# Patient Record
Sex: Female | Born: 1950 | Race: Black or African American | Hispanic: No | Marital: Married | Smoking: Former smoker
Health system: Southern US, Community
[De-identification: ages and names within clinical notes are randomized; demographics above are authoritative.]

## PROBLEM LIST (undated history)

## (undated) DIAGNOSIS — E785 Hyperlipidemia, unspecified: Secondary | ICD-10-CM

## (undated) DIAGNOSIS — R011 Cardiac murmur, unspecified: Secondary | ICD-10-CM

## (undated) DIAGNOSIS — K219 Gastro-esophageal reflux disease without esophagitis: Secondary | ICD-10-CM

## (undated) DIAGNOSIS — F329 Major depressive disorder, single episode, unspecified: Secondary | ICD-10-CM

## (undated) DIAGNOSIS — I10 Essential (primary) hypertension: Secondary | ICD-10-CM

## (undated) DIAGNOSIS — H269 Unspecified cataract: Secondary | ICD-10-CM

## (undated) DIAGNOSIS — F419 Anxiety disorder, unspecified: Secondary | ICD-10-CM

## (undated) DIAGNOSIS — F32A Depression, unspecified: Secondary | ICD-10-CM

## (undated) DIAGNOSIS — J189 Pneumonia, unspecified organism: Secondary | ICD-10-CM

## (undated) DIAGNOSIS — E0500 Thyrotoxicosis with diffuse goiter without thyrotoxic crisis or storm: Secondary | ICD-10-CM

## (undated) HISTORY — DX: Thyrotoxicosis with diffuse goiter without thyrotoxic crisis or storm: E05.00

## (undated) HISTORY — PX: PARTIAL HYSTERECTOMY: SHX80

## (undated) HISTORY — DX: Essential (primary) hypertension: I10

## (undated) HISTORY — PX: POLYPECTOMY: SHX149

## (undated) HISTORY — DX: Unspecified cataract: H26.9

## (undated) HISTORY — DX: Cardiac murmur, unspecified: R01.1

## (undated) HISTORY — DX: Hyperlipidemia, unspecified: E78.5

---
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
## 1998-01-20 ENCOUNTER — Other Ambulatory Visit: Admission: RE | Admit: 1998-01-20 | Discharge: 1998-01-20 | Payer: Self-pay | Admitting: Obstetrics and Gynecology

## 1998-02-27 ENCOUNTER — Ambulatory Visit (HOSPITAL_COMMUNITY): Admission: RE | Admit: 1998-02-27 | Discharge: 1998-02-27 | Payer: Self-pay | Admitting: Obstetrics and Gynecology

## 1999-01-30 ENCOUNTER — Other Ambulatory Visit: Admission: RE | Admit: 1999-01-30 | Discharge: 1999-01-30 | Payer: Self-pay | Admitting: Obstetrics and Gynecology

## 1999-03-09 ENCOUNTER — Ambulatory Visit (HOSPITAL_COMMUNITY): Admission: RE | Admit: 1999-03-09 | Discharge: 1999-03-09 | Payer: Self-pay | Admitting: Obstetrics and Gynecology

## 1999-03-09 ENCOUNTER — Encounter: Payer: Self-pay | Admitting: Obstetrics and Gynecology

## 2000-01-30 ENCOUNTER — Other Ambulatory Visit: Admission: RE | Admit: 2000-01-30 | Discharge: 2000-01-30 | Payer: Self-pay | Admitting: Obstetrics and Gynecology

## 2000-03-10 ENCOUNTER — Ambulatory Visit (HOSPITAL_COMMUNITY): Admission: RE | Admit: 2000-03-10 | Discharge: 2000-03-10 | Payer: Self-pay | Admitting: Obstetrics and Gynecology

## 2000-03-10 ENCOUNTER — Encounter: Payer: Self-pay | Admitting: Obstetrics and Gynecology

## 2001-03-26 ENCOUNTER — Ambulatory Visit (HOSPITAL_COMMUNITY): Admission: RE | Admit: 2001-03-26 | Discharge: 2001-03-26 | Payer: Self-pay | Admitting: Obstetrics and Gynecology

## 2001-03-26 ENCOUNTER — Encounter: Payer: Self-pay | Admitting: Obstetrics and Gynecology

## 2001-10-06 ENCOUNTER — Other Ambulatory Visit: Admission: RE | Admit: 2001-10-06 | Discharge: 2001-10-06 | Payer: Self-pay | Admitting: Obstetrics and Gynecology

## 2002-04-20 ENCOUNTER — Encounter: Payer: Self-pay | Admitting: Obstetrics and Gynecology

## 2002-04-20 ENCOUNTER — Ambulatory Visit (HOSPITAL_COMMUNITY): Admission: RE | Admit: 2002-04-20 | Discharge: 2002-04-20 | Payer: Self-pay | Admitting: Obstetrics and Gynecology

## 2002-11-09 ENCOUNTER — Other Ambulatory Visit: Admission: RE | Admit: 2002-11-09 | Discharge: 2002-11-09 | Payer: Self-pay | Admitting: Obstetrics and Gynecology

## 2004-11-04 HISTORY — PX: COLONOSCOPY: SHX174

## 2004-12-20 ENCOUNTER — Ambulatory Visit: Payer: Self-pay | Admitting: Internal Medicine

## 2005-01-03 ENCOUNTER — Ambulatory Visit: Payer: Self-pay | Admitting: Internal Medicine

## 2005-02-27 ENCOUNTER — Ambulatory Visit (HOSPITAL_COMMUNITY): Admission: RE | Admit: 2005-02-27 | Discharge: 2005-02-27 | Payer: Self-pay | Admitting: Obstetrics and Gynecology

## 2008-10-04 ENCOUNTER — Ambulatory Visit (HOSPITAL_COMMUNITY): Admission: RE | Admit: 2008-10-04 | Discharge: 2008-10-04 | Payer: Self-pay | Admitting: Internal Medicine

## 2008-11-04 HISTORY — PX: CATARACT EXTRACTION: SUR2

## 2009-11-09 ENCOUNTER — Encounter: Admission: RE | Admit: 2009-11-09 | Discharge: 2009-11-09 | Payer: Self-pay | Admitting: Family Medicine

## 2010-07-17 ENCOUNTER — Ambulatory Visit (HOSPITAL_COMMUNITY): Admission: RE | Admit: 2010-07-17 | Discharge: 2010-07-17 | Payer: Self-pay | Admitting: Internal Medicine

## 2012-01-01 ENCOUNTER — Encounter: Payer: Self-pay | Admitting: Internal Medicine

## 2012-01-20 ENCOUNTER — Encounter: Payer: Self-pay | Admitting: Internal Medicine

## 2012-02-24 ENCOUNTER — Ambulatory Visit (AMBULATORY_SURGERY_CENTER): Payer: PRIVATE HEALTH INSURANCE | Admitting: *Deleted

## 2012-02-24 VITALS — Ht 61.5 in | Wt 174.1 lb

## 2012-02-24 DIAGNOSIS — Z860100 Personal history of colon polyps, unspecified: Secondary | ICD-10-CM

## 2012-02-24 DIAGNOSIS — Z1211 Encounter for screening for malignant neoplasm of colon: Secondary | ICD-10-CM

## 2012-02-24 DIAGNOSIS — Z800 Family history of malignant neoplasm of digestive organs: Secondary | ICD-10-CM

## 2012-02-24 MED ORDER — PEG-KCL-NACL-NASULF-NA ASC-C 100 G PO SOLR: 1.0000 | Freq: Once | ORAL | Status: DC

## 2012-03-05 ENCOUNTER — Encounter: Payer: Self-pay | Admitting: Internal Medicine

## 2012-03-05 ENCOUNTER — Ambulatory Visit (AMBULATORY_SURGERY_CENTER): Payer: PRIVATE HEALTH INSURANCE | Admitting: Internal Medicine

## 2012-03-05 VITALS — BP 154/85 | HR 58 | Temp 98.5°F | Resp 19 | Ht 61.5 in | Wt 174.0 lb

## 2012-03-05 DIAGNOSIS — Z1211 Encounter for screening for malignant neoplasm of colon: Secondary | ICD-10-CM

## 2012-03-05 DIAGNOSIS — D126 Benign neoplasm of colon, unspecified: Secondary | ICD-10-CM

## 2012-03-05 MED ORDER — SODIUM CHLORIDE 0.9 % IV SOLN: 500.0000 mL | INTRAVENOUS | Status: DC

## 2012-03-05 NOTE — Progress Notes (Signed)
Propofol was administered by Marrion Coy, CRNA to the for the procedure. Maw

## 2012-03-05 NOTE — Progress Notes (Signed)
The pt tolerated the colonoscopy very well. Maw   

## 2012-03-05 NOTE — Op Note (Signed)
[facility] [address] [place], [place]  [address]  COLONOSCOPY PROCEDURE REPORT  PATIENT:  Kristy Yang, Kristy Yang  MR#:  [id number] BIRTHDATE:  04/07/1951, 60 yrs. old  GENDER:  female ENDOSCOPIST:  Hedwig Morton. Juanda Chance, MD REF. BY:  Robert Bellow, M.D. PROCEDURE DATE:  03/05/2012 PROCEDURE:  Colonoscopy with biopsy ASA CLASS:  Class I INDICATIONS:  history of polyps last colon 2006, small polyp not removed maternal aunt with colon cancer MEDICATIONS:   MAC sedation, administered by CRNA, propofol (Diprivan) 300 mg  DESCRIPTION OF PROCEDURE:   After the risks and benefits and of the procedure were explained, informed consent was obtained. Digital rectal exam was performed and revealed no rectal masses. The LB CF-[id number] endoscope was introduced through the anus and advanced to the cecum, which was identified by both the appendix and ileocecal valve.  The quality of the prep was good, using MoviPrep.  The instrument was then slowly withdrawn as the colon was fully examined. <<PROCEDUREIMAGES>>  FINDINGS:  A sessile polyp was found. 5 mm polyp at 70 cm Polyp was snared without cautery. Retrieval was successful (see image1). snare polyp  This was otherwise a normal examination of the colon (see image5, image4, image3, and image2).   Retroflexed views in the rectum revealed no abnormalities.    The scope was then withdrawn from the patient and the procedure completed.  COMPLICATIONS:  None ENDOSCOPIC IMPRESSION: 1) Sessile polyp 2) Otherwise normal examination RECOMMENDATIONS: 1) Await pathology results 2) High fiber diet.  REPEAT EXAM:  In 10 year(s) for.  ______________________________ Hedwig Morton. Juanda Chance, MD  CC:  n. eSIGNED:   Hedwig Morton. Pocahontas Cohenour at 03/05/2012 11:09 AM  Shelba Flake, [id number]

## 2012-03-05 NOTE — Progress Notes (Signed)
Patient did not have preoperative order for IV antibiotic SSI prophylaxis. (G8918)  Patient did not experience any of the following events: a burn prior to discharge; a fall within the facility; wrong site/side/patient/procedure/implant event; or a hospital transfer or hospital admission upon discharge from the facility. (G8907)  

## 2012-03-05 NOTE — Patient Instructions (Signed)
YOU HAD AN ENDOSCOPIC PROCEDURE TODAY AT THE [facility]: Refer to the procedure report that was given to you for any specific questions about what was found during the examination.  If the procedure report does not answer your questions, please call your gastroenterologist to clarify.  If you requested that your care partner not be given the details of your procedure findings, then the procedure report has been included in a sealed envelope for you to review at your convenience later.  YOU SHOULD EXPECT: Some feelings of bloating in the abdomen. Passage of more gas than usual.  Walking can help get rid of the air that was put into your GI tract during the procedure and reduce the bloating. If you had a lower endoscopy (such as a colonoscopy or flexible sigmoidoscopy) you may notice spotting of blood in your stool or on the toilet paper. If you underwent a bowel prep for your procedure, then you may not have a normal bowel movement for a few days.  DIET: Your first meal following the procedure should be a light meal and then it is ok to progress to your normal diet.  A half-sandwich or bowl of soup is an example of a good first meal.  Heavy or fried foods are harder to digest and may make you feel nauseous or bloated.  Likewise meals heavy in dairy and vegetables can cause extra gas to form and this can also increase the bloating.  Drink plenty of fluids but you should avoid alcoholic beverages for 24 hours.  ACTIVITY: Your care partner should take you home directly after the procedure.  You should plan to take it easy, moving slowly for the rest of the day.  You can resume normal activity the day after the procedure however you should NOT DRIVE or use heavy machinery for 24 hours (because of the sedation medicines used during the test).    SYMPTOMS TO REPORT IMMEDIATELY: A gastroenterologist can be reached at any hour.  During normal business hours, 8:30 AM to 5:00 PM Monday through Friday,  call [phone].  After hours and on weekends, please call the GI answering service at [phone] who will take a message and have the physician on call contact you.   Following lower endoscopy (colonoscopy or flexible sigmoidoscopy):  Excessive amounts of blood in the stool  Significant tenderness or worsening of abdominal pains  Swelling of the abdomen that is new, acute  Fever of 100F or higher  Following upper endoscopy (EGD)  Vomiting of blood or coffee ground material  New chest pain or pain under the shoulder blades  Painful or persistently difficult swallowing  New shortness of breath  Fever of 100F or higher  Black, tarry-looking stools  FOLLOW UP: If any biopsies were taken you will be contacted by phone or by letter within the next 1-3 weeks.  Call your gastroenterologist if you have not heard about the biopsies in 3 weeks.  Our staff will call the home number listed on your records the next business day following your procedure to check on you and address any questions or concerns that you may have at that time regarding the information given to you following your procedure. This is a courtesy call and so if there is no answer at the home number and we have not heard from you through the emergency physician on call, we will assume that you have returned to your regular daily activities without incident.  SIGNATURES/CONFIDENTIALITY: You and/or your care   partner have signed paperwork which will be entered into your electronic medical record.  These signatures attest to the fact that that the information above on your After Visit Summary has been reviewed and is understood.  Full responsibility of the confidentiality of this discharge information lies with you and/or your care-partner.   HANDOUT ON POLYPS 

## 2012-03-06 ENCOUNTER — Telehealth: Payer: Self-pay | Admitting: *Deleted

## 2012-03-06 NOTE — Telephone Encounter (Signed)
Left message on voice mail of number pt left in admitting yest that identifies pt by first and last name. ewm

## 2012-03-10 ENCOUNTER — Encounter: Payer: Self-pay | Admitting: Internal Medicine

## 2012-05-05 ENCOUNTER — Other Ambulatory Visit: Payer: Self-pay | Admitting: Internal Medicine

## 2012-05-05 DIAGNOSIS — Z1231 Encounter for screening mammogram for malignant neoplasm of breast: Secondary | ICD-10-CM

## 2012-05-27 ENCOUNTER — Ambulatory Visit (HOSPITAL_COMMUNITY): Payer: PRIVATE HEALTH INSURANCE

## 2012-06-11 ENCOUNTER — Ambulatory Visit (HOSPITAL_COMMUNITY)
Admission: RE | Admit: 2012-06-11 | Discharge: 2012-06-11 | Disposition: A | Discharge status: Home / Self Care | Payer: 59 | Source: Ambulatory Visit | Attending: Internal Medicine | Admitting: Internal Medicine

## 2012-06-11 DIAGNOSIS — Z1231 Encounter for screening mammogram for malignant neoplasm of breast: Secondary | ICD-10-CM | POA: Insufficient documentation

## 2012-06-15 ENCOUNTER — Ambulatory Visit (INDEPENDENT_AMBULATORY_CARE_PROVIDER_SITE_OTHER): Payer: PRIVATE HEALTH INSURANCE | Admitting: Internal Medicine

## 2012-06-15 ENCOUNTER — Encounter: Payer: Self-pay | Admitting: Internal Medicine

## 2012-06-15 ENCOUNTER — Ambulatory Visit: Payer: PRIVATE HEALTH INSURANCE

## 2012-06-15 VITALS — BP 160/89 | HR 60 | Temp 98.1°F | Resp 16 | Ht 62.5 in | Wt 170.0 lb

## 2012-06-15 DIAGNOSIS — Z0000 Encounter for general adult medical examination without abnormal findings: Secondary | ICD-10-CM

## 2012-06-15 DIAGNOSIS — Z23 Encounter for immunization: Secondary | ICD-10-CM

## 2012-06-15 DIAGNOSIS — I10 Essential (primary) hypertension: Secondary | ICD-10-CM

## 2012-06-15 DIAGNOSIS — Z79899 Other long term (current) drug therapy: Secondary | ICD-10-CM

## 2012-06-15 DIAGNOSIS — E049 Nontoxic goiter, unspecified: Secondary | ICD-10-CM

## 2012-06-15 DIAGNOSIS — E039 Hypothyroidism, unspecified: Secondary | ICD-10-CM

## 2012-06-15 DIAGNOSIS — E7800 Pure hypercholesterolemia, unspecified: Secondary | ICD-10-CM

## 2012-06-15 LAB — POCT URINALYSIS DIPSTICK
Glucose, UA: NEGATIVE
Ketones, UA: NEGATIVE
Leukocytes, UA: NEGATIVE
Nitrite, UA: NEGATIVE
Protein, UA: NEGATIVE
Urobilinogen, UA: 0.2

## 2012-06-15 LAB — CBC WITH DIFFERENTIAL/PLATELET
Basophils Absolute: 0 10*3/uL (ref 0.0–0.1)
Basophils Relative: 1 % (ref 0–1)
Hemoglobin: 12.9 g/dL (ref 12.0–15.0)
Lymphocytes Relative: 50 % — ABNORMAL HIGH (ref 12–46)
Monocytes Absolute: 0.2 10*3/uL (ref 0.1–1.0)
Monocytes Relative: 5 % (ref 3–12)
Neutrophils Relative %: 42 % — ABNORMAL LOW (ref 43–77)
RDW: 14.1 % (ref 11.5–15.5)

## 2012-06-15 LAB — POCT UA - MICROSCOPIC ONLY
Bacteria, U Microscopic: NEGATIVE
Casts, Ur, LPF, POC: NEGATIVE
Mucus, UA: NEGATIVE
WBC, Ur, HPF, POC: NEGATIVE

## 2012-06-15 NOTE — Progress Notes (Signed)
  Subjective:    Patient ID: Kristy Yang, female    DOB: 1951-05-20, 61 y.o.   MRN: [id number]  HPI Here for cpe Has lost weight and feels good. Hx of thyroid goiter,htn, and dyslipidemia. Stopped her pravastatin, no particular reason. Taking her bp meds. See scanned hx   Review of Systems See scanned ros    Objective:   Physical Exam  Constitutional: She is oriented to person, place, and time. She appears well-developed and well-nourished. No distress.  HENT:  Right Ear: External ear normal.  Left Ear: External ear normal.  Nose: Nose normal.  Mouth/Throat: Oropharynx is clear and moist.  Eyes: EOM are normal. Pupils are equal, round, and reactive to light.  Neck: Normal range of motion. Neck supple. No thyromegaly present.  Cardiovascular: Normal rate, regular rhythm and normal heart sounds.   Pulmonary/Chest: Effort normal and breath sounds normal.  Abdominal: Soft.  Musculoskeletal: Normal range of motion.  Lymphadenopathy:    She has no cervical adenopathy.  Neurological: She is alert and oriented to person, place, and time. She has normal reflexes. No cranial nerve deficit. She exhibits normal muscle tone. Coordination normal.  Skin: Skin is warm and dry.  Psychiatric: She has a normal mood and affect. Her behavior is normal. Judgment and thought content normal.   Refuses breast exam, agreed to do self exam and to get a mammogram.       Assessment & Plan:  HTN/ Dyslipidemia RF meds 36yr

## 2012-06-16 DIAGNOSIS — E049 Nontoxic goiter, unspecified: Secondary | ICD-10-CM | POA: Insufficient documentation

## 2012-06-16 DIAGNOSIS — E039 Hypothyroidism, unspecified: Secondary | ICD-10-CM | POA: Insufficient documentation

## 2012-06-16 DIAGNOSIS — I10 Essential (primary) hypertension: Secondary | ICD-10-CM | POA: Insufficient documentation

## 2012-06-16 LAB — COMPREHENSIVE METABOLIC PANEL
ALT: 10 U/L (ref 0–35)
AST: 19 U/L (ref 0–37)
Albumin: 4.5 g/dL (ref 3.5–5.2)
Alkaline Phosphatase: 57 U/L (ref 39–117)
BUN: 10 mg/dL (ref 6–23)
Calcium: 9.6 mg/dL (ref 8.4–10.5)
Total Protein: 7.7 g/dL (ref 6.0–8.3)

## 2012-06-16 LAB — LIPID PANEL
Cholesterol: 239 mg/dL — ABNORMAL HIGH (ref 0–200)
Total CHOL/HDL Ratio: 4.9 Ratio
Triglycerides: 93 mg/dL (ref <150)
VLDL: 19 mg/dL (ref 0–40)

## 2012-06-16 LAB — TSH: TSH: 0.754 u[IU]/mL (ref 0.350–4.500)

## 2012-06-18 ENCOUNTER — Encounter: Payer: Self-pay | Admitting: Radiology

## 2012-07-26 ENCOUNTER — Ambulatory Visit (INDEPENDENT_AMBULATORY_CARE_PROVIDER_SITE_OTHER): Payer: PRIVATE HEALTH INSURANCE | Admitting: Family Medicine

## 2012-07-26 VITALS — BP 160/90 | HR 76 | Temp 99.4°F | Resp 18 | Ht 62.0 in | Wt 168.2 lb

## 2012-07-26 DIAGNOSIS — J40 Bronchitis, not specified as acute or chronic: Secondary | ICD-10-CM

## 2012-07-26 MED ORDER — HYDROCODONE-HOMATROPINE 5-1.5 MG/5ML PO SYRP: 5.0000 mL | ORAL_SOLUTION | Freq: Three times a day (TID) | ORAL | Status: DC | PRN

## 2012-07-26 MED ORDER — AZITHROMYCIN 250 MG PO TABS: ORAL_TABLET | ORAL | Status: DC

## 2012-07-26 NOTE — Progress Notes (Signed)
61 yo woman with worsening cough and sinus congestion over past week.  Also c/o irritated throat and PND.  Objective: NAD HEENT:  Unremarkable Chest: bilateral wheezes and ronchi Heart:  Reg, no murmur Neck:  Supple, no adenop or thyromegaly Skin:  Clear  Assessment   1. Bronchitis  HYDROcodone-homatropine (HYCODAN) 5-1.5 MG/5ML syrup, azithromycin (ZITHROMAX Z-PAK) 250 MG tablet

## 2012-07-26 NOTE — Patient Instructions (Signed)

## 2012-08-21 ENCOUNTER — Other Ambulatory Visit: Payer: Self-pay | Admitting: Family Medicine

## 2012-08-30 ENCOUNTER — Other Ambulatory Visit: Payer: Self-pay | Admitting: Family Medicine

## 2012-10-19 ENCOUNTER — Other Ambulatory Visit: Payer: Self-pay | Admitting: Physician Assistant

## 2012-12-30 ENCOUNTER — Other Ambulatory Visit: Payer: Self-pay | Admitting: Physician Assistant

## 2013-01-25 ENCOUNTER — Encounter (HOSPITAL_BASED_OUTPATIENT_CLINIC_OR_DEPARTMENT_OTHER): Payer: Self-pay

## 2013-01-25 ENCOUNTER — Emergency Department (HOSPITAL_BASED_OUTPATIENT_CLINIC_OR_DEPARTMENT_OTHER)
Admission: EM | Admit: 2013-01-25 | Discharge: 2013-01-25 | Disposition: A | Discharge status: Home / Self Care | Payer: 59 | Attending: Emergency Medicine | Admitting: Emergency Medicine

## 2013-01-25 DIAGNOSIS — Z79899 Other long term (current) drug therapy: Secondary | ICD-10-CM | POA: Insufficient documentation

## 2013-01-25 DIAGNOSIS — I10 Essential (primary) hypertension: Secondary | ICD-10-CM

## 2013-01-25 DIAGNOSIS — E785 Hyperlipidemia, unspecified: Secondary | ICD-10-CM | POA: Insufficient documentation

## 2013-01-25 DIAGNOSIS — E876 Hypokalemia: Secondary | ICD-10-CM | POA: Insufficient documentation

## 2013-01-25 DIAGNOSIS — Z87891 Personal history of nicotine dependence: Secondary | ICD-10-CM | POA: Insufficient documentation

## 2013-01-25 DIAGNOSIS — Z862 Personal history of diseases of the blood and blood-forming organs and certain disorders involving the immune mechanism: Secondary | ICD-10-CM | POA: Insufficient documentation

## 2013-01-25 DIAGNOSIS — H53149 Visual discomfort, unspecified: Secondary | ICD-10-CM | POA: Insufficient documentation

## 2013-01-25 DIAGNOSIS — Z7982 Long term (current) use of aspirin: Secondary | ICD-10-CM | POA: Insufficient documentation

## 2013-01-25 DIAGNOSIS — R011 Cardiac murmur, unspecified: Secondary | ICD-10-CM | POA: Insufficient documentation

## 2013-01-25 DIAGNOSIS — R51 Headache: Secondary | ICD-10-CM | POA: Insufficient documentation

## 2013-01-25 DIAGNOSIS — Z8639 Personal history of other endocrine, nutritional and metabolic disease: Secondary | ICD-10-CM | POA: Insufficient documentation

## 2013-01-25 LAB — URINALYSIS, ROUTINE W REFLEX MICROSCOPIC
Glucose, UA: NEGATIVE mg/dL
Ketones, ur: NEGATIVE mg/dL
Protein, ur: NEGATIVE mg/dL
pH: 7 (ref 5.0–8.0)

## 2013-01-25 LAB — BASIC METABOLIC PANEL
CO2: 30 mEq/L (ref 19–32)
GFR calc non Af Amer: >90 mL/min (ref >90)
Glucose, Bld: 108 mg/dL — ABNORMAL HIGH (ref 70–99)
Potassium: 2.7 mEq/L — CL (ref 3.5–5.1)
Sodium: 142 mEq/L (ref 135–145)

## 2013-01-25 LAB — URINE MICROSCOPIC-ADD ON

## 2013-01-25 MED ORDER — POTASSIUM CHLORIDE CRYS ER 20 MEQ PO TBCR
40.0000 meq | EXTENDED_RELEASE_TABLET | Freq: Once | ORAL | Status: AC
  Administered 2013-01-25: 40 meq via ORAL
  Filled 2013-01-25: qty 2

## 2013-01-25 MED ORDER — POTASSIUM CHLORIDE 10 MEQ/100ML IV SOLN
10.0000 meq | Freq: Once | INTRAVENOUS | Status: AC
  Administered 2013-01-25: 10 meq via INTRAVENOUS
  Filled 2013-01-25: qty 100

## 2013-01-25 NOTE — ED Notes (Signed)
Pt presents today c/o hypertension and headache.  Pt states that she is photophobic today.  Pt is taking medication as prescribed but may have missed a dose of blood pressure medication on Saturday.

## 2013-01-25 NOTE — ED Provider Notes (Signed)
History  This chart was scribed for Kristy Chick, MD by Shari Heritage, ED Scribe. The patient was seen in room MH05/MH05. Patient's care was started at 1643.   CSN: [id number]  Arrival date & time 01/25/13  1612   First MD Initiated Contact with Patient 01/25/13 1643      Chief Complaint  Patient presents with  . Hypertension     Patient is a 62 y.o. female presenting with headaches. The history is provided by the patient. No language interpreter was used.  Headache Pain location:  Generalized Radiates to:  Does not radiate Onset quality:  Gradual Timing:  Constant Progression:  Partially resolved Chronicity:  New Relieved by: Aleve. Associated symptoms: photophobia   Associated symptoms: no diarrhea, no fever, no nausea and no vomiting      HPI Comments: Kristy Yang is a 62 y.o. Female with history of hypertension who presents to the Emergency Department complaining of significantly improved, diffuse, non-radiating headache onset several hours ago. There is associated photophobia. Patient states that the headache began gradually and got progressively worse until she took Aleve which provided relief. Patient says that she had her blood pressure after her headache developed. The first reading was 160/110. EMT's took a second reading of 180/119 when they arrived on the scene. She states that her blood pressure has not been consistently elevated over the past couple of days or weeks and that today's high readings are not usual. She takes BP medicines as instructed.  Patient usually takes her BP medicines in the evening (Norvasc, hydrochlorothiazide, metoprolol). Patient denies chest pain, swelling, fever, shortness of breath, nausea, vomiting or diarrhea. She has a medical history of heart murmur, hyperlipidemia, cataracts and Grave's Disease.   PCP - Guest   Past Medical History  Diagnosis Date  . Heart murmur   . Hypertension   . Hyperlipidemia   . Cataracts, bilateral    . Grave's disease     Past Surgical History  Procedure Laterality Date  . Cataract extraction  2010    bilateral  . Colonoscopy  2006    Family History  Problem Relation Age of Onset  . Colon cancer Sister   . Colon cancer Maternal Aunt   . Rectal cancer Neg Hx   . Stomach cancer Neg Hx   . Esophageal cancer Neg Hx     History  Substance Use Topics  . Smoking status: Former Smoker    Quit date: 07/26/1992  . Smokeless tobacco: Never Used  . Alcohol Use: No    OB History   Grav Para Term Preterm Abortions TAB SAB Ect Mult Living                  Review of Systems  Constitutional: Negative for fever.  Eyes: Positive for photophobia.  Respiratory: Negative for shortness of breath.   Cardiovascular: Negative for chest pain and leg swelling.  Gastrointestinal: Negative for nausea, vomiting and diarrhea.  Neurological: Positive for headaches.  All other systems reviewed and are negative.    Allergies  Review of patient's allergies indicates no known allergies.  Home Medications   Current Outpatient Rx  Name  Route  Sig  Dispense  Refill  . amLODipine (NORVASC) 10 MG tablet   Oral   Take 1 tablet (10 mg total) by mouth daily. Needs office visit   90 tablet   0   . hydrochlorothiazide (HYDRODIURIL) 25 MG tablet      TAKE 1 TABLET DAILY   30  tablet   0   . KLOR-CON M20 20 MEQ tablet      TAKE 1 TABLET DAILY   30 tablet   0   . metoprolol succinate (TOPROL-XL) 50 MG 24 hr tablet   Oral   Take 1 tablet (50 mg total) by mouth daily. Needs office visit   90 tablet   0   . aspirin 81 MG chewable tablet   Oral   Chew 81 mg by mouth every other day.         Marland Kitchen azithromycin (ZITHROMAX Z-PAK) 250 MG tablet      Take as directed on pack   6 tablet   0   . HYDROcodone-homatropine (HYCODAN) 5-1.5 MG/5ML syrup   Oral   Take 5 mLs by mouth every 8 (eight) hours as needed for cough.   120 mL   0     Triage Vitals: BP 186/98  Pulse 78   Temp(Src) 98.2 F (36.8 C) (Oral)  Resp 18  Ht 5\' 2"  (1.575 m)  Wt 170 lb (77.111 kg)  BMI 31.09 kg/m2  SpO2 98%  Physical Exam  Constitutional: She is oriented to person, place, and time. She appears well-developed and well-nourished. No distress.  HENT:  Head: Normocephalic and atraumatic.  Right Ear: Tympanic membrane, external ear and ear canal normal.  Left Ear: Tympanic membrane and ear canal normal.  Mouth/Throat: Oropharynx is clear and moist.  Eyes: Conjunctivae and EOM are normal. Pupils are equal, round, and reactive to light.  Cardiovascular: Normal rate, regular rhythm and normal heart sounds.   No murmur heard. Pulmonary/Chest: Effort normal and breath sounds normal. No respiratory distress. She has no wheezes. She has no rales.  Musculoskeletal: Normal range of motion.  Neurological: She is alert and oriented to person, place, and time.  No focal neurological deficits. CN 2-12 intact. Strength and sensation intact. Speech is normal.    ED Course  Procedures (including critical care time) DIAGNOSTIC STUDIES: Oxygen Saturation is 98% on room air, normal by my interpretation.    COORDINATION OF CARE: 5:44 PM- Patient informed of current plan for treatment and evaluation and agrees with plan at this time.    Date: 01/25/2013  Rate: 68  Rhythm: normal sinus rhythm  QRS Axis: normal  Intervals: normal  ST/T Wave abnormalities: nonspecific t wave abnormaltiies  Conduction Disutrbances:none  Narrative Interpretation:   Old EKG Reviewed: none available    Labs Reviewed  URINALYSIS, ROUTINE W REFLEX MICROSCOPIC - Abnormal; Notable for the following:    Hgb urine dipstick SMALL (*)    All other components within normal limits  BASIC METABOLIC PANEL - Abnormal; Notable for the following:    Potassium 2.7 (*)    Glucose, Bld 108 (*)    All other components within normal limits  URINE MICROSCOPIC-ADD ON    No results found.   1. Hypertension   2.  Hypokalemia   3. Headache       MDM  Pt presentign with c/o headache that was resolved after alleve, she has no signs of end organ damage.  Neuro exam normal.  Advised close follow up with her PMD to monitor blood pressure.  Discharged with strict return precautions.  Pt agreeable with plan.    I personally performed the services described in this documentation, which was scribed in my presence. The recorded information has been reviewed and is accurate.    Kristy Chick, MD 01/25/13 2308

## 2013-01-25 NOTE — ED Notes (Signed)
Lab called to give critical value of K+ 2.7

## 2013-02-17 ENCOUNTER — Ambulatory Visit (INDEPENDENT_AMBULATORY_CARE_PROVIDER_SITE_OTHER): Payer: PRIVATE HEALTH INSURANCE | Admitting: Physician Assistant

## 2013-02-17 VITALS — BP 120/82 | HR 73 | Temp 99.1°F | Resp 16 | Ht 62.0 in | Wt 171.8 lb

## 2013-02-17 DIAGNOSIS — I10 Essential (primary) hypertension: Secondary | ICD-10-CM

## 2013-02-17 MED ORDER — AMLODIPINE BESYLATE 10 MG PO TABS: 10.0000 mg | ORAL_TABLET | Freq: Every day | ORAL | Status: AC

## 2013-02-17 MED ORDER — POTASSIUM CHLORIDE CRYS ER 20 MEQ PO TBCR: 20.0000 meq | EXTENDED_RELEASE_TABLET | Freq: Every day | ORAL | Status: DC

## 2013-02-17 MED ORDER — HYDROCHLOROTHIAZIDE 25 MG PO TABS: 25.0000 mg | ORAL_TABLET | Freq: Every day | ORAL | Status: DC

## 2013-02-17 MED ORDER — METOPROLOL SUCCINATE ER 50 MG PO TB24: 50.0000 mg | ORAL_TABLET | Freq: Every day | ORAL | Status: DC

## 2013-02-17 NOTE — Patient Instructions (Addendum)
Continue taking your medicines faithfully.  Schedule your annual physical for sometime in the next 4-6 months.  Let us know if any concerns arise before then.  Hypertension As your heart beats, it forces blood through your arteries. This force is your blood pressure. If the pressure is too high, it is called hypertension (HTN) or high blood pressure. HTN is dangerous because you may have it and not know it. High blood pressure may mean that your heart has to work harder to pump blood. Your arteries may be narrow or stiff. The extra work puts you at risk for heart disease, stroke, and other problems.  Blood pressure consists of two numbers, a higher number over a lower, 110/72, for example. It is stated as "110 over 72." The ideal is below 120 for the top number (systolic) and under 80 for the bottom (diastolic). Write down your blood pressure today. You should pay close attention to your blood pressure if you have certain conditions such as:  Heart failure.  Prior heart attack.  Diabetes  Chronic kidney disease.  Prior stroke.  Multiple risk factors for heart disease. To see if you have HTN, your blood pressure should be measured while you are seated with your arm held at the level of the heart. It should be measured at least twice. A one-time elevated blood pressure reading (especially in the Emergency Department) does not mean that you need treatment. There may be conditions in which the blood pressure is different between your right and left arms. It is important to see your caregiver soon for a recheck. Most people have essential hypertension which means that there is not a specific cause. This type of high blood pressure may be lowered by changing lifestyle factors such as:  Stress.  Smoking.  Lack of exercise.  Excessive weight.  Drug/tobacco/alcohol use.  Eating less salt. Most people do not have symptoms from high blood pressure until it has caused damage to the body.  Effective treatment can often prevent, delay or reduce that damage. TREATMENT  When a cause has been identified, treatment for high blood pressure is directed at the cause. There are a large number of medications to treat HTN. These fall into several categories, and your caregiver will help you select the medicines that are best for you. Medications may have side effects. You should review side effects with your caregiver. If your blood pressure stays high after you have made lifestyle changes or started on medicines,   Your medication(s) may need to be changed.  Other problems may need to be addressed.  Be certain you understand your prescriptions, and know how and when to take your medicine.  Be sure to follow up with your caregiver within the time frame advised (usually within two weeks) to have your blood pressure rechecked and to review your medications.  If you are taking more than one medicine to lower your blood pressure, make sure you know how and at what times they should be taken. Taking two medicines at the same time can result in blood pressure that is too low. SEEK IMMEDIATE MEDICAL CARE IF:  You develop a severe headache, blurred or changing vision, or confusion.  You have unusual weakness or numbness, or a faint feeling.  You have severe chest or abdominal pain, vomiting, or breathing problems. MAKE SURE YOU:   Understand these instructions.  Will watch your condition.  Will get help right away if you are not doing well or get worse. Document Released: 10/21/2005  Document Revised: 01/13/2012 Document Reviewed: 06/10/2008 [facility] Patient Information 2013 [place], [place].

## 2013-02-17 NOTE — Progress Notes (Signed)
  Subjective:    Patient ID: Kristy Yang, female    DOB: [birth year], 62 y.o.   MRN: [id number]  HPI   Kristy Yang is a very pleasant 62 yr old female here for RF of HTN medication.  Pt currently takes amlodipine, metoprolol, hctz and a potassium supplement.  States she takes her medication faithfully.  Does check BPs at home and HTN appears well controlled.  She does state that she had an episode of increased BP (to 185 systolic) and headache for which she sought treatment at the ED a couple weeks ago.  Since that time BP readings have been normal.  Denies any symptoms of hypotension.  Last CPE was here in Aug 2013.   Review of Systems  Constitutional: Negative.   HENT: Negative.   Respiratory: Negative for cough and wheezing.   Cardiovascular: Negative for chest pain, palpitations and leg swelling.  Gastrointestinal: Negative.   Musculoskeletal: Negative.   Neurological: Negative.  Negative for dizziness, syncope and light-headedness.       Objective:   Physical Exam  Vitals reviewed. Constitutional: She is oriented to person, place, and time. She appears well-developed and well-nourished. No distress.  HENT:  Head: Normocephalic and atraumatic.  Eyes: Conjunctivae are normal. No scleral icterus.  Cardiovascular: Normal rate, regular rhythm and normal heart sounds.   Pulmonary/Chest: Effort normal and breath sounds normal.  Neurological: She is alert and oriented to person, place, and time.  Skin: Skin is warm and dry.  Psychiatric: She has a normal mood and affect. Her behavior is normal.     Filed Vitals:   02/17/13 1617  BP: 120/82  Pulse: 73  Temp: 99.1 F (37.3 C)  Resp: 16        Assessment & Plan:  Hypertension - Plan: amLODipine (NORVASC) 10 MG tablet, hydrochlorothiazide (HYDRODIURIL) 25 MG tablet, potassium chloride SA (KLOR-CON M20) 20 MEQ tablet, metoprolol succinate (TOPROL-XL) 50 MG 24 hr tablet   Kristy Yang is a pleasant 62 yr old female here for  refills on HTN medication.  BP is normal today.  Pt states she does monitor BP at home.  Medications refilled for 6 months.  Discussed with pt that she will need to schedule CPE before this runs out.  Discussed symptoms of hypotension.  Discussed RTC and ED precautions.  Pt understands and is in agreement with this plan.

## 2013-02-18 ENCOUNTER — Telehealth: Payer: Self-pay | Admitting: Physician Assistant

## 2013-02-18 DIAGNOSIS — Z8639 Personal history of other endocrine, nutritional and metabolic disease: Secondary | ICD-10-CM

## 2013-02-18 DIAGNOSIS — I10 Essential (primary) hypertension: Secondary | ICD-10-CM

## 2013-02-18 NOTE — Telephone Encounter (Signed)
LM for pt regarding coming in for lab only visit.  On further review of her chart, pt had low potassium at ED visit 3 weeks ago.  Would like to recheck and make sure this has correct.  Future order placed in Epic.

## 2013-02-20 NOTE — Progress Notes (Signed)
Agree with plan below and see phone call encounter. Hx of hypokalemia on prior labs and will plan of having her to return for labwork to look at potassium and renal fxn.

## 2013-02-22 ENCOUNTER — Encounter: Payer: Self-pay | Admitting: Physician Assistant

## 2013-02-24 ENCOUNTER — Telehealth: Payer: Self-pay | Admitting: Physician Assistant

## 2013-02-24 NOTE — Telephone Encounter (Signed)
Please call pt and confirm that she got my voicemail regarding coming in for a lab draw to recheck her potassium.  It was very low when she was in the ED a couple weeks ago, and I want to make sure it has improved with the potassium supplement.

## 2013-02-24 NOTE — Telephone Encounter (Signed)
Patient advised, she states she has forgotten to do this, advised she will come in, states thanks for the reminder. Amy

## 2013-03-22 ENCOUNTER — Ambulatory Visit: Payer: PRIVATE HEALTH INSURANCE | Admitting: Internal Medicine

## 2013-07-22 ENCOUNTER — Other Ambulatory Visit: Payer: Self-pay

## 2013-07-22 DIAGNOSIS — I10 Essential (primary) hypertension: Secondary | ICD-10-CM

## 2013-07-22 MED ORDER — METOPROLOL SUCCINATE ER 50 MG PO TB24: 50.0000 mg | ORAL_TABLET | Freq: Every day | ORAL | Status: DC

## 2013-10-19 ENCOUNTER — Other Ambulatory Visit: Payer: Self-pay | Admitting: Physician Assistant

## 2015-07-05 ENCOUNTER — Encounter: Payer: Self-pay | Admitting: Internal Medicine

## 2017-01-30 ENCOUNTER — Encounter: Payer: Self-pay | Admitting: Gastroenterology

## 2017-02-27 ENCOUNTER — Encounter: Payer: Self-pay | Admitting: Gastroenterology

## 2017-03-27 ENCOUNTER — Ambulatory Visit (AMBULATORY_SURGERY_CENTER): Payer: Self-pay

## 2017-03-27 VITALS — Ht 61.0 in | Wt 178.4 lb

## 2017-03-27 DIAGNOSIS — Z86010 Personal history of colonic polyps: Secondary | ICD-10-CM

## 2017-03-27 MED ORDER — NA SULFATE-K SULFATE-MG SULF 17.5-3.13-1.6 GM/177ML PO SOLN: 1.0000 | Freq: Once | ORAL | 0 refills | Status: AC

## 2017-03-27 NOTE — Progress Notes (Signed)
Denies allergies to eggs or soy products. Denies complication of anesthesia or sedation. Denies use of weight loss medication. Denies use of O2.   Emmi instructions given for colonoscopy.  

## 2017-03-28 ENCOUNTER — Encounter: Payer: Self-pay | Admitting: Gastroenterology

## 2017-04-04 ENCOUNTER — Other Ambulatory Visit: Payer: Self-pay | Admitting: Oncology

## 2017-04-08 ENCOUNTER — Encounter: Payer: Self-pay | Admitting: Gastroenterology

## 2017-04-08 ENCOUNTER — Ambulatory Visit (AMBULATORY_SURGERY_CENTER): Payer: Managed Care, Other (non HMO) | Admitting: Gastroenterology

## 2017-04-08 VITALS — BP 144/84 | HR 57 | Temp 98.9°F | Resp 12 | Ht 62.0 in | Wt 171.0 lb

## 2017-04-08 DIAGNOSIS — D123 Benign neoplasm of transverse colon: Secondary | ICD-10-CM

## 2017-04-08 DIAGNOSIS — D122 Benign neoplasm of ascending colon: Secondary | ICD-10-CM | POA: Diagnosis not present

## 2017-04-08 DIAGNOSIS — Z86010 Personal history of colonic polyps: Secondary | ICD-10-CM | POA: Diagnosis present

## 2017-04-08 MED ORDER — SODIUM CHLORIDE 0.9 % IV SOLN: 500.0000 mL | INTRAVENOUS | Status: DC

## 2017-04-08 NOTE — Progress Notes (Signed)
Pt's states no medical or surgical changes since previsit or office visit. 

## 2017-04-08 NOTE — Patient Instructions (Signed)
YOU HAD AN ENDOSCOPIC PROCEDURE TODAY AT THE [facility]:   Refer to the procedure report that was given to you for any specific questions about what was found during the examination.  If the procedure report does not answer your questions, please call your gastroenterologist to clarify.  If you requested that your care partner not be given the details of your procedure findings, then the procedure report has been included in a sealed envelope for you to review at your convenience later.  YOU SHOULD EXPECT: Some feelings of bloating in the abdomen. Passage of more gas than usual.  Walking can help get rid of the air that was put into your GI tract during the procedure and reduce the bloating. If you had a lower endoscopy (such as a colonoscopy or flexible sigmoidoscopy) you may notice spotting of blood in your stool or on the toilet paper. If you underwent a bowel prep for your procedure, you may not have a normal bowel movement for a few days.  Please Note:  You might notice some irritation and congestion in your nose or some drainage.  This is from the oxygen used during your procedure.  There is no need for concern and it should clear up in a day or so.  SYMPTOMS TO REPORT IMMEDIATELY:   Following lower endoscopy (colonoscopy or flexible sigmoidoscopy):  Excessive amounts of blood in the stool  Significant tenderness or worsening of abdominal pains  Swelling of the abdomen that is new, acute  Fever of 100F or higher  For urgent or emergent issues, a gastroenterologist can be reached at any hour by calling [phone].   DIET:  We do recommend a small meal at first, but then you may proceed to your regular diet.  Drink plenty of fluids but you should avoid alcoholic beverages for 24 hours.  ACTIVITY:  You should plan to take it easy for the rest of today and you should NOT DRIVE or use heavy machinery until tomorrow (because of the sedation medicines used during the test).     FOLLOW UP: Our staff will call the number listed on your records the next business day following your procedure to check on you and address any questions or concerns that you may have regarding the information given to you following your procedure. If we do not reach you, we will leave a message.  However, if you are feeling well and you are not experiencing any problems, there is no need to return our call.  We will assume that you have returned to your regular daily activities without incident.  If any biopsies were taken you will be contacted by phone or by letter within the next 1-3 weeks.  Please call us at [phone] if you have not heard about the biopsies in 3 weeks.   Await for biopsy results to determine next repeat Colonoscopy screening Polyps (handout given)  SIGNATURES/CONFIDENTIALITY: You and/or your care partner have signed paperwork which will be entered into your electronic medical record.  These signatures attest to the fact that that the information above on your After Visit Summary has been reviewed and is understood.  Full responsibility of the confidentiality of this discharge information lies with you and/or your care-partner. 

## 2017-04-08 NOTE — Progress Notes (Signed)
To recovery, report to Jones, RN, VSS 

## 2017-04-08 NOTE — Progress Notes (Signed)
Called to room to assist during endoscopic procedure.  Patient ID and intended procedure confirmed with present staff. Received instructions for my participation in the procedure from the performing physician.  

## 2017-04-08 NOTE — Op Note (Signed)
[facility] Patient Name: Kristy Yang Procedure Date: 04/08/2017 2:40 PM MRN: [id number] Endoscopist: Mallie Mussel L. Loletha Carrow , MD Age: 66 Referring MD:  Date of Birth: [birth year] Gender: Female Account #: [id number] Procedure:                Colonoscopy Indications:              Surveillance: Personal history of adenomatous                            polyps on last colonoscopy 5 years ago Medicines:                Monitored Anesthesia Care Procedure:                Pre-Anesthesia Assessment:                           - Prior to the procedure, a History and Physical                            was performed, and patient medications and                            allergies were reviewed. The patient's tolerance of                            previous anesthesia was also reviewed. The risks                            and benefits of the procedure and the sedation                            options and risks were discussed with the patient.                            All questions were answered, and informed consent                            was obtained. Anticoagulants: The patient has taken                            aspirin. It was decided not to withhold this                            medication prior to the procedure. ASA Grade                            Assessment: II - A patient with mild systemic                            disease. After reviewing the risks and benefits,                            the patient was deemed in satisfactory condition to  undergo the procedure.                           After obtaining informed consent, the colonoscope                            was passed under direct vision. Throughout the                            procedure, the patient's blood pressure, pulse, and                            oxygen saturations were monitored continuously. The                            Model PCF-[id number]) scope was introduced                             through the anus and advanced to the the cecum,                            identified by appendiceal orifice and ileocecal                            valve. The colonoscopy was performed without                            difficulty. The patient tolerated the procedure                            well. The quality of the bowel preparation was                            excellent. The ileocecal valve, appendiceal                            orifice, and rectum were photographed. The quality                            of the bowel preparation was evaluated using the                            BBPS [facility] Bowel Preparation Scale) with scores                            of: Right Colon = 3, Transverse Colon = 3 and Left                            Colon = 3 (entire mucosa seen well with no residual                            staining, small fragments of stool or opaque  liquid). The total BBPS score equals 9. The bowel                            preparation used was SUPREP. Scope In: 3:04:12 PM Scope Out: 3:19:57 PM Scope Withdrawal Time: 0 hours 11 minutes 17 seconds  Total Procedure Duration: 0 hours 15 minutes 45 seconds  Findings:                 The perianal and digital rectal examinations were                            normal.                           Three sessile polyps were found in the transverse                            colon and ascending colon. The polyps were 2 mm in                            size. These polyps were removed with a cold snare.                            Resection and retrieval were complete.                           The exam was otherwise without abnormality on                            direct and retroflexion views. Complications:            No immediate complications. Estimated Blood Loss:     Estimated blood loss: none. Impression:               - Three 2 mm polyps in the transverse colon and in                             the ascending colon, removed with a cold snare.                            Resected and retrieved.                           - The examination was otherwise normal on direct                            and retroflexion views. Recommendation:           - Patient has a contact number available for                            emergencies. The signs and symptoms of potential                            delayed complications were discussed with the  patient. Return to normal activities tomorrow.                            Written discharge instructions were provided to the                            patient.                           - Resume previous diet.                           - Continue present medications.                           - Await pathology results.                           - Repeat colonoscopy is recommended for                            surveillance. The colonoscopy date will be                            determined after pathology results from today's                            exam become available for review. [name] L. Loletha Carrow, MD 04/08/2017 3:29:00 PM This report has been signed electronically.

## 2017-04-09 ENCOUNTER — Telehealth: Payer: Self-pay | Admitting: *Deleted

## 2017-04-09 NOTE — Telephone Encounter (Signed)
  No answer, and no way to leave message. Other number out of service.

## 2017-04-09 NOTE — Telephone Encounter (Signed)
Message that wireless customer is not available.

## 2017-04-15 ENCOUNTER — Encounter: Payer: Self-pay | Admitting: Gastroenterology

## 2019-01-06 ENCOUNTER — Telehealth (HOSPITAL_COMMUNITY): Payer: Self-pay | Admitting: Psychiatry

## 2019-10-05 ENCOUNTER — Emergency Department (HOSPITAL_COMMUNITY): Payer: Self-pay

## 2019-10-05 ENCOUNTER — Emergency Department (HOSPITAL_COMMUNITY)
Admission: EM | Admit: 2019-10-05 | Discharge: 2019-10-05 | Disposition: A | Discharge status: Home / Self Care | Payer: Worker's Compensation | Attending: Emergency Medicine | Admitting: Emergency Medicine

## 2019-10-05 ENCOUNTER — Encounter (HOSPITAL_COMMUNITY): Payer: Self-pay

## 2019-10-05 ENCOUNTER — Emergency Department (HOSPITAL_COMMUNITY): Payer: Worker's Compensation

## 2019-10-05 ENCOUNTER — Other Ambulatory Visit: Payer: Self-pay

## 2019-10-05 DIAGNOSIS — Z87891 Personal history of nicotine dependence: Secondary | ICD-10-CM | POA: Diagnosis not present

## 2019-10-05 DIAGNOSIS — Y939 Activity, unspecified: Secondary | ICD-10-CM | POA: Insufficient documentation

## 2019-10-05 DIAGNOSIS — I10 Essential (primary) hypertension: Secondary | ICD-10-CM | POA: Insufficient documentation

## 2019-10-05 DIAGNOSIS — Z79899 Other long term (current) drug therapy: Secondary | ICD-10-CM | POA: Diagnosis not present

## 2019-10-05 DIAGNOSIS — S82035A Nondisplaced transverse fracture of left patella, initial encounter for closed fracture: Secondary | ICD-10-CM | POA: Insufficient documentation

## 2019-10-05 DIAGNOSIS — E039 Hypothyroidism, unspecified: Secondary | ICD-10-CM | POA: Insufficient documentation

## 2019-10-05 DIAGNOSIS — S80912A Unspecified superficial injury of left knee, initial encounter: Secondary | ICD-10-CM | POA: Diagnosis present

## 2019-10-05 DIAGNOSIS — W010XXA Fall on same level from slipping, tripping and stumbling without subsequent striking against object, initial encounter: Secondary | ICD-10-CM | POA: Diagnosis not present

## 2019-10-05 DIAGNOSIS — Y9259 Other trade areas as the place of occurrence of the external cause: Secondary | ICD-10-CM | POA: Diagnosis not present

## 2019-10-05 DIAGNOSIS — Y990 Civilian activity done for income or pay: Secondary | ICD-10-CM | POA: Diagnosis not present

## 2019-10-05 MED ORDER — MORPHINE SULFATE (PF) 4 MG/ML IV SOLN
4.0000 mg | Freq: Once | INTRAVENOUS | Status: AC
  Administered 2019-10-05: 4 mg via INTRAVENOUS
  Filled 2019-10-05: qty 1

## 2019-10-05 MED ORDER — HYDROCODONE-ACETAMINOPHEN 5-325 MG PO TABS: 1.0000 | ORAL_TABLET | Freq: Four times a day (QID) | ORAL | 0 refills | Status: DC | PRN

## 2019-10-05 NOTE — ED Notes (Signed)
Patient transported to X-ray via stretcher 

## 2019-10-05 NOTE — ED Provider Notes (Signed)
[facility] DEPT Provider Note   CSN: [id number] Arrival date & time: 10/05/19  1716     History   Chief Complaint Chief Complaint  Patient presents with  . Knee Injury    secondary to trip and fall    HPI Kristy Yang is a 68 y.o. female.     HPI Patient presents emergency room for evaluation of a knee injury.  Patient was at work today when she tripped and fell.  Patient landed on her left knee.  She thinks she hit her head but did not lose consciousness and is not having a headache.  Patient denies any other injuries.  Pain in her knee is severe.  She is unable to move her knee and had to call EMS to transport her to the ED.  Patient denies any numbness or weakness. Past Medical History:  Diagnosis Date  . Cataracts, bilateral   . Grave's disease   . Heart murmur   . Hyperlipidemia   . Hypertension     Patient Active Problem List   Diagnosis Date Noted  . HTN (hypertension) 06/16/2012  . Hypothyroid 06/16/2012  . Goiter 06/16/2012    Past Surgical History:  Procedure Laterality Date  . CATARACT EXTRACTION  2010   bilateral  . COLONOSCOPY  2006  . PARTIAL HYSTERECTOMY    . POLYPECTOMY       OB History   No obstetric history on file.      Home Medications    Prior to Admission medications   Medication Sig Start Date End Date Taking? Authorizing Provider  amLODipine (NORVASC) 10 MG tablet Take 1 tablet (10 mg total) by mouth daily. 02/17/13  Yes Bailey Mech E, PA-C  DULoxetine (CYMBALTA) 30 MG capsule Take 30 mg by mouth 2 (two) times daily. 01/25/19  Yes [provider]  hydrochlorothiazide (HYDRODIURIL) 25 MG tablet Take 1 tablet (25 mg total) by mouth daily. PATIENT NEEDS OFFICE VISIT FOR ADDITIONAL REFILLS 10/19/13  Yes Bailey Mech E, PA-C  metoprolol succinate (TOPROL-XL) 50 MG 24 hr tablet Take 1 tablet (50 mg total) by mouth daily. PATIENT NEEDS OFFICE VISIT FOR ADDITIONAL REFILLS 10/19/13  Yes Bailey Mech E, PA-C  omeprazole (PRILOSEC) 20 MG capsule Take 20 mg by mouth daily as needed (heart burn).  05/26/19  Yes [provider]  potassium chloride SA (KLOR-CON M20) 20 MEQ tablet Take 1 tablet (20 mEq total) by mouth daily. 02/17/13  Yes Bailey Mech E, PA-C  HYDROcodone-acetaminophen (NORCO/VICODIN) 5-325 MG tablet Take 1 tablet by mouth every 6 (six) hours as needed. 10/05/19   Dorie Rank, MD    Family History Family History  Problem Relation Age of Onset  . Colon cancer Sister   . Colon cancer Maternal Aunt   . Diabetes Mother   . Diabetes Father   . Stroke Maternal Grandmother   . Rectal cancer Neg Hx   . Stomach cancer Neg Hx   . Esophageal cancer Neg Hx     Social History Social History   Tobacco Use  . Smoking status: Former Smoker    Quit date: 07/26/1992    Years since quitting: 27.2  . Smokeless tobacco: Never Used  Substance Use Topics  . Alcohol use: No  . Drug use: No     Allergies   Patient has no known allergies.   Review of Systems Review of Systems  All other systems reviewed and are negative.    Physical Exam Updated Vital Signs BP Marland Kitchen)  171/97   Pulse 66   Temp 98.2 F (36.8 C) (Oral)   Resp 18   SpO2 100%   Physical Exam Vitals signs and nursing note reviewed.  Constitutional:      General: She is not in acute distress.    Appearance: She is well-developed.  HENT:     Head: Normocephalic and atraumatic.     Right Ear: External ear normal.     Left Ear: External ear normal.  Eyes:     General: No scleral icterus.       Right eye: No discharge.        Left eye: No discharge.     Conjunctiva/sclera: Conjunctivae normal.  Neck:     Musculoskeletal: Neck supple.     Trachea: No tracheal deviation.  Cardiovascular:     Rate and Rhythm: Normal rate.  Pulmonary:     Effort: Pulmonary effort is normal. No respiratory distress.     Breath sounds: No stridor.  Abdominal:     General: There is no distension.   Musculoskeletal:        General: No swelling or deformity.     Left hip: Normal.     Left knee: She exhibits decreased range of motion, swelling and effusion. Tenderness found.     Left ankle: Normal.     Cervical back: Normal.  Skin:    General: Skin is warm and dry.     Findings: No rash.  Neurological:     Mental Status: She is alert.     Cranial Nerves: Cranial nerve deficit: no gross deficits.      ED Treatments / Results  Labs (all labs ordered are listed, but only abnormal results are displayed) Labs Reviewed - No data to display  EKG None  Radiology Dg Knee Complete 4 Views Left  Result Date: 10/05/2019 CLINICAL DATA:  68 year old female with fall and left knee pain. EXAM: LEFT KNEE - COMPLETE 4+ VIEW COMPARISON:  None. FINDINGS: There is a nondisplaced transverse fracture of the patella with an approximately 3 mm distraction gap. No other acute fracture identified. Probable old healed fracture of the proximal fibula. There is a small suprapatellar effusion. The soft tissues are unremarkable. IMPRESSION: 1. Nondisplaced transverse fracture of the patella. 2. Small suprapatellar effusion. Electronically Signed   By: Anner Crete M.D.   On: 10/05/2019 19:07    Procedures Procedures (including critical care time)  Medications Ordered in ED Medications  morphine 4 MG/ML injection 4 mg (4 mg Intravenous Given 10/05/19 1818)  morphine 4 MG/ML injection 4 mg (4 mg Intravenous Given 10/05/19 1937)     Initial Impression / Assessment and Plan / ED Course  I have reviewed the triage vital signs and the nursing notes.  Pertinent labs & imaging results that were available during my care of the patient were reviewed by me and considered in my medical decision making (see chart for details).   X-rays demonstrate a nondisplaced transverse fracture of the patella.  No other injuries.  Patient is neurovascularly intact.  Patient was placed in a knee immobilizer and crutches.   Discharged home with a course of pain medications.  Outpatient follow-up with orthopedics.  Final Clinical Impressions(s) / ED Diagnoses   Final diagnoses:  Closed displaced transverse fracture of left patella, initial encounter    ED Discharge Orders         Ordered    HYDROcodone-acetaminophen (NORCO/VICODIN) 5-325 MG tablet  Every 6 hours PRN     10/05/19  1944           Dorie Rank, MD 10/05/19 1947

## 2019-10-05 NOTE — Discharge Instructions (Signed)
Apply ice to help with the swelling, take the medications as needed for pain, use the knee immobilizer and crutches

## 2019-10-05 NOTE — ED Triage Notes (Signed)
Arrived [facility] from work. Patient tripped and fell over metal bar, injuring left knee. Patient states she fell forward, unsure if she hit forehead ("but my head does not hurt"). Patient reports pain from knee down into ankle.

## 2019-10-08 ENCOUNTER — Ambulatory Visit (INDEPENDENT_AMBULATORY_CARE_PROVIDER_SITE_OTHER): Payer: Worker's Compensation | Admitting: Orthopedic Surgery

## 2019-10-08 ENCOUNTER — Other Ambulatory Visit: Payer: Self-pay

## 2019-10-08 ENCOUNTER — Ambulatory Visit (INDEPENDENT_AMBULATORY_CARE_PROVIDER_SITE_OTHER): Payer: Worker's Compensation

## 2019-10-08 DIAGNOSIS — M25562 Pain in left knee: Secondary | ICD-10-CM

## 2019-10-08 MED ORDER — HYDROCODONE-ACETAMINOPHEN 5-325 MG PO TABS: 1.0000 | ORAL_TABLET | Freq: Four times a day (QID) | ORAL | 0 refills | Status: DC | PRN

## 2019-10-09 ENCOUNTER — Encounter: Payer: Self-pay | Admitting: Orthopedic Surgery

## 2019-10-09 NOTE — Progress Notes (Signed)
Office Visit Note   Patient: Kristy Yang           Date of Birth: 1950-12-07           MRN: [id number] Visit Date: 10/08/2019 Requested by: [address], [place],  [place] [address] PCP: Ella Jubilee Desiree Hane, MD  Subjective: Chief Complaint  Patient presents with  . Left Knee - Pain, Injury    HPI: Patient presents with left knee injury.  Date of injury 10/05/2019.  She tripped over a metal bar in the floor at work and fell on her knee.  She has been in a knee immobilizer since that time.  No prior knee injury or surgery.  She has been ambulating with crutches.  No personal or family history of DVT or pulmonary embolism.  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              ROS: All systems reviewed are negative as they relate to the chief complaint within the history of present illness.  Patient denies  fevers or chills.   Assessment & Plan: Visit Diagnoses:  1. Acute pain of left knee     Plan: Impression is displaced distracted left knee patella fracture which probably exceeds the osteocyte jumping distance for this fracture.  Plan is open reduction internal fixation of the fracture.  Risk and benefits are discussed including not limited to infection nerve vessel damage incomplete healing as well as potential for delayed healing.  Patient understands the risk and benefits and wishes to proceed.  All questions answered.  I think that she would likely be out of work at least 3 weeks possibly 6 weeks following the surgery.  Norco is prescribed.  Continue with knee immobilization and okay for touchdown weightbearing for transfers with the knee in an immobilizer.  Follow-Up Instructions: No follow-ups on file.   Orders:  Orders Placed This Encounter  Procedures  . XR Knee 1-2 Views Left   Meds ordered this encounter  Medications  . HYDROcodone-acetaminophen (NORCO/VICODIN) 5-325 MG tablet    Sig: Take 1 tablet by mouth every 6 (six)  hours as needed.    Dispense:  30 tablet    Refill:  0      Procedures: No procedures performed   Clinical Data: No additional findings.  Objective: Vital Signs: There were no vitals taken for this visit.  Physical Exam:   Constitutional: Patient appears well-developed HEENT:  Head: Normocephalic Eyes:EOM are normal Neck: Normal range of motion Cardiovascular: Normal rate Pulmonary/chest: Effort normal Neurologic: Patient is alert Skin: Skin is warm Psychiatric: Patient has normal mood and affect    Ortho Exam: Ortho exam demonstrates swelling in that left knee region.  The skin itself is intact.  No calf tenderness.  Pedal pulses are palpable.  Patient has pretty weak extension and cannot do a straight leg raise.  Collaterals are stable.  Specialty Comments:  No specialty comments available.  Imaging: Xr Knee 1-2 Views Left  Result Date: 10/09/2019 Lateral left knee reviewed.  Again noted is transverse slightly comminuted patella fracture at the equator of the patella with about 3 mm of displacement.  Minimal step-off is noted at the articular surface.    PMFS History: Patient Active Problem List   Diagnosis Date Noted  . HTN (hypertension) 06/16/2012  . Hypothyroid 06/16/2012  . Goiter 06/16/2012   Past Medical History:  Diagnosis Date  . Cataracts, bilateral   . Grave's disease   .  Heart murmur   . Hyperlipidemia   . Hypertension     Family History  Problem Relation Age of Onset  . Colon cancer Sister   . Colon cancer Maternal Aunt   . Diabetes Mother   . Diabetes Father   . Stroke Maternal Grandmother   . Rectal cancer Neg Hx   . Stomach cancer Neg Hx   . Esophageal cancer Neg Hx     Past Surgical History:  Procedure Laterality Date  . CATARACT EXTRACTION  2010   bilateral  . COLONOSCOPY  2006  . PARTIAL HYSTERECTOMY    . POLYPECTOMY     Social History   Occupational History  . Not on file  Tobacco Use  . Smoking status: Former  Smoker    Quit date: 07/26/1992    Years since quitting: 27.2  . Smokeless tobacco: Never Used  Substance and Sexual Activity  . Alcohol use: No  . Drug use: No  . Sexual activity: Not on file

## 2019-10-11 ENCOUNTER — Other Ambulatory Visit: Payer: Self-pay

## 2019-10-11 ENCOUNTER — Other Ambulatory Visit (HOSPITAL_COMMUNITY)
Admission: RE | Admit: 2019-10-11 | Discharge: 2019-10-11 | Disposition: A | Discharge status: Home / Self Care | Payer: Worker's Compensation | Source: Ambulatory Visit | Attending: Orthopedic Surgery | Admitting: Orthopedic Surgery

## 2019-10-12 ENCOUNTER — Other Ambulatory Visit: Payer: Self-pay

## 2019-10-12 ENCOUNTER — Encounter (HOSPITAL_COMMUNITY): Payer: Self-pay | Admitting: *Deleted

## 2019-10-12 LAB — NOVEL CORONAVIRUS, NAA (HOSP ORDER, SEND-OUT TO REF LAB; TAT 18-24 HRS): SARS-CoV-2, NAA: NOT DETECTED

## 2019-10-12 NOTE — Anesthesia Preprocedure Evaluation (Addendum)
Anesthesia Evaluation  Patient identified by MRN, date of birth, ID band Patient awake    Reviewed: Allergy & Precautions, NPO status , Patient's Chart, lab work & pertinent test results  History of Anesthesia Complications Negative for: history of anesthetic complications  Airway Mallampati: II  TM Distance: >3 FB Neck ROM: Full    Dental no notable dental hx. (+) Dental Advisory Given   Pulmonary neg pulmonary ROS, former smoker,    Pulmonary exam normal        Cardiovascular hypertension, Pt. on medications Normal cardiovascular exam     Neuro/Psych PSYCHIATRIC DISORDERS Anxiety Depression negative neurological ROS     GI/Hepatic Neg liver ROS, GERD  ,  Endo/Other  Hypothyroidism Hyperthyroidism   Renal/GU negative Renal ROS     Musculoskeletal negative musculoskeletal ROS (+)   Abdominal   Peds  Hematology negative hematology ROS (+)   Anesthesia Other Findings Day of surgery medications reviewed with the patient.  Reproductive/Obstetrics                         Anesthesia Physical Anesthesia Plan  ASA: II  Anesthesia Plan: General   Post-op Pain Management:    Induction: Intravenous  PONV Risk Score and Plan: 3 and Ondansetron, Dexamethasone and Midazolam  Airway Management Planned: LMA and Oral ETT  Additional Equipment:   Intra-op Plan:   Post-operative Plan: Extubation in OR  Informed Consent: I have reviewed the patients History and Physical, chart, labs and discussed the procedure including the risks, benefits and alternatives for the proposed anesthesia with the patient or authorized representative who has indicated his/her understanding and acceptance.     Dental advisory given  Plan Discussed with: CRNA and Anesthesiologist  Anesthesia Plan Comments: (Stress echo 07/21/18 (care everywhere): Stress Interpretation  Normal resting left ventricular function, with  no obvious resting segmental  abnormality.  Normal hypercontractile response throughout, with no obvious induced wall  motion abnormality.  Appropriate hemodynamic response to exercise. No significant ST T wave  changes with exercise.  Results  Global LVEF (rest): Normal (LVEF >50%)  Global LVEF (stress): Hyperkinetic (LVEF >70%)  ECG  No ST-T wave changes.  Arrhythmias  No rhythm abnormality.  Symptoms  No cardiovascular symptoms with maximal exercise.)       Anesthesia Quick Evaluation

## 2019-10-12 NOTE — Progress Notes (Signed)
Spoke with pt for pre-op call. Pt states she has a heart murmur, but states it's never given her any problems. Pt states she is not diabetic.  Pt had her Covid test done on 10/11/19, it is negative. Pt states that she has been quarantine since the test was done and understands to remain in quarantine until day of surgery.   Pt instructed not to eat food after midnight Wednesday, but may have clear liquids until 1:00 PM Thursday. Pt's husband will come by the hospital today to pick up the Pre-Surgery Ensure for patient to drink on Thursday between 12:45 PM and 1 PM. Pt voiced understanding.

## 2019-10-13 ENCOUNTER — Other Ambulatory Visit: Payer: Self-pay | Admitting: Surgical

## 2019-10-13 MED ORDER — METHOCARBAMOL 500 MG PO TABS: 500.0000 mg | ORAL_TABLET | Freq: Three times a day (TID) | ORAL | 0 refills | Status: DC | PRN

## 2019-10-13 MED ORDER — OXYCODONE HCL 5 MG PO TABS: 5.0000 mg | ORAL_TABLET | ORAL | 0 refills | Status: DC | PRN

## 2019-10-13 MED ORDER — ASPIRIN EC 81 MG PO TBEC: 81.0000 mg | DELAYED_RELEASE_TABLET | Freq: Every day | ORAL | 0 refills | Status: AC

## 2019-10-14 ENCOUNTER — Other Ambulatory Visit: Payer: Self-pay

## 2019-10-14 ENCOUNTER — Ambulatory Visit (HOSPITAL_COMMUNITY): Payer: Worker's Compensation | Admitting: Physician Assistant

## 2019-10-14 ENCOUNTER — Ambulatory Visit (HOSPITAL_COMMUNITY)
Admission: RE | Admit: 2019-10-14 | Discharge: 2019-10-14 | Disposition: A | Discharge status: Home / Self Care | Payer: Worker's Compensation | Attending: Orthopedic Surgery | Admitting: Orthopedic Surgery

## 2019-10-14 ENCOUNTER — Encounter (HOSPITAL_COMMUNITY)
Admission: RE | Disposition: A | Discharge status: Home / Self Care | Payer: Self-pay | Source: Home / Self Care | Attending: Orthopedic Surgery

## 2019-10-14 ENCOUNTER — Encounter (HOSPITAL_COMMUNITY): Payer: Self-pay | Admitting: Orthopedic Surgery

## 2019-10-14 ENCOUNTER — Ambulatory Visit (HOSPITAL_COMMUNITY): Payer: 59 | Attending: Orthopedic Surgery

## 2019-10-14 DIAGNOSIS — W19XXXA Unspecified fall, initial encounter: Secondary | ICD-10-CM | POA: Insufficient documentation

## 2019-10-14 DIAGNOSIS — Z7982 Long term (current) use of aspirin: Secondary | ICD-10-CM | POA: Insufficient documentation

## 2019-10-14 DIAGNOSIS — F329 Major depressive disorder, single episode, unspecified: Secondary | ICD-10-CM | POA: Insufficient documentation

## 2019-10-14 DIAGNOSIS — I10 Essential (primary) hypertension: Secondary | ICD-10-CM | POA: Insufficient documentation

## 2019-10-14 DIAGNOSIS — Z87891 Personal history of nicotine dependence: Secondary | ICD-10-CM | POA: Insufficient documentation

## 2019-10-14 DIAGNOSIS — S82002A Unspecified fracture of left patella, initial encounter for closed fracture: Secondary | ICD-10-CM | POA: Diagnosis not present

## 2019-10-14 DIAGNOSIS — E785 Hyperlipidemia, unspecified: Secondary | ICD-10-CM | POA: Insufficient documentation

## 2019-10-14 DIAGNOSIS — K219 Gastro-esophageal reflux disease without esophagitis: Secondary | ICD-10-CM | POA: Insufficient documentation

## 2019-10-14 DIAGNOSIS — Z79899 Other long term (current) drug therapy: Secondary | ICD-10-CM | POA: Insufficient documentation

## 2019-10-14 DIAGNOSIS — S82032G Displaced transverse fracture of left patella, subsequent encounter for closed fracture with delayed healing: Secondary | ICD-10-CM

## 2019-10-14 DIAGNOSIS — Z419 Encounter for procedure for purposes other than remedying health state, unspecified: Secondary | ICD-10-CM | POA: Diagnosis present

## 2019-10-14 DIAGNOSIS — F419 Anxiety disorder, unspecified: Secondary | ICD-10-CM | POA: Diagnosis not present

## 2019-10-14 HISTORY — DX: Anxiety disorder, unspecified: F41.9

## 2019-10-14 HISTORY — DX: Depression, unspecified: F32.A

## 2019-10-14 HISTORY — DX: Gastro-esophageal reflux disease without esophagitis: K21.9

## 2019-10-14 HISTORY — DX: Pneumonia, unspecified organism: J18.9

## 2019-10-14 HISTORY — PX: ORIF PATELLA: SHX5033

## 2019-10-14 LAB — CBC
HCT: 34.9 % — ABNORMAL LOW (ref 36.0–46.0)
Hemoglobin: 11.7 g/dL — ABNORMAL LOW (ref 12.0–15.0)
MCH: 30.2 pg (ref 26.0–34.0)
MCHC: 33.5 g/dL (ref 30.0–36.0)
MCV: 90.2 fL (ref 80.0–100.0)
Platelets: 314 10*3/uL (ref 150–400)
RBC: 3.87 MIL/uL (ref 3.87–5.11)
RDW: 12.7 % (ref 11.5–15.5)
WBC: 4.5 10*3/uL (ref 4.0–10.5)
nRBC: 0 % (ref 0.0–0.2)

## 2019-10-14 LAB — BASIC METABOLIC PANEL
Anion gap: 10 (ref 5–15)
BUN: 15 mg/dL (ref 8–23)
CO2: 31 mmol/L (ref 22–32)
Calcium: 9.3 mg/dL (ref 8.9–10.3)
Chloride: 97 mmol/L — ABNORMAL LOW (ref 98–111)
Creatinine, Ser: 0.69 mg/dL (ref 0.44–1.00)
GFR calc Af Amer: >60 mL/min (ref >60)
GFR calc non Af Amer: >60 mL/min (ref >60)
Glucose, Bld: 110 mg/dL — ABNORMAL HIGH (ref 70–99)
Potassium: 2.7 mmol/L — CL (ref 3.5–5.1)
Sodium: 138 mmol/L (ref 135–145)

## 2019-10-14 LAB — POTASSIUM: Potassium: 2.7 mmol/L — CL (ref 3.5–5.1)

## 2019-10-14 SURGERY — OPEN REDUCTION INTERNAL FIXATION (ORIF) PATELLA
Anesthesia: General | Site: Knee | Laterality: Left

## 2019-10-14 MED ORDER — CLONIDINE HCL (ANALGESIA) 100 MCG/ML EP SOLN
EPIDURAL | Status: AC
  Filled 2019-10-14: qty 10

## 2019-10-14 MED ORDER — ACETAMINOPHEN 500 MG PO TABS
ORAL_TABLET | ORAL | Status: AC
  Administered 2019-10-14: 1000 mg via ORAL
  Filled 2019-10-14: qty 2

## 2019-10-14 MED ORDER — ONDANSETRON HCL 4 MG/2ML IJ SOLN
INTRAMUSCULAR | Status: DC | PRN
  Administered 2019-10-14: 4 mg via INTRAVENOUS

## 2019-10-14 MED ORDER — DEXAMETHASONE SODIUM PHOSPHATE 10 MG/ML IJ SOLN
INTRAMUSCULAR | Status: AC
  Filled 2019-10-14: qty 1

## 2019-10-14 MED ORDER — FENTANYL CITRATE (PF) 100 MCG/2ML IJ SOLN
INTRAMUSCULAR | Status: AC
  Administered 2019-10-14: 100 ug via INTRAVENOUS
  Filled 2019-10-14: qty 2

## 2019-10-14 MED ORDER — PHENYLEPHRINE 40 MCG/ML (10ML) SYRINGE FOR IV PUSH (FOR BLOOD PRESSURE SUPPORT)
PREFILLED_SYRINGE | INTRAVENOUS | Status: DC | PRN
  Administered 2019-10-14 (×4): 80 ug via INTRAVENOUS

## 2019-10-14 MED ORDER — ONDANSETRON HCL 4 MG/2ML IJ SOLN
INTRAMUSCULAR | Status: AC
  Filled 2019-10-14: qty 2

## 2019-10-14 MED ORDER — FENTANYL CITRATE (PF) 100 MCG/2ML IJ SOLN
INTRAMUSCULAR | Status: AC
  Filled 2019-10-14: qty 2

## 2019-10-14 MED ORDER — MIDAZOLAM HCL 2 MG/2ML IJ SOLN
INTRAMUSCULAR | Status: AC
  Administered 2019-10-14: 2 mg via INTRAVENOUS
  Filled 2019-10-14: qty 2

## 2019-10-14 MED ORDER — 0.9 % SODIUM CHLORIDE (POUR BTL) OPTIME
TOPICAL | Status: DC | PRN
  Administered 2019-10-14: 1000 mL

## 2019-10-14 MED ORDER — FENTANYL CITRATE (PF) 250 MCG/5ML IJ SOLN
INTRAMUSCULAR | Status: AC
  Filled 2019-10-14: qty 5

## 2019-10-14 MED ORDER — BUPIVACAINE HCL (PF) 0.25 % IJ SOLN
INTRAMUSCULAR | Status: AC
  Filled 2019-10-14: qty 10

## 2019-10-14 MED ORDER — CELECOXIB 200 MG PO CAPS: 400.0000 mg | ORAL_CAPSULE | Freq: Once | ORAL | Status: AC

## 2019-10-14 MED ORDER — CLONIDINE HCL (ANALGESIA) 100 MCG/ML EP SOLN
EPIDURAL | Status: DC | PRN
  Administered 2019-10-14: 100 ug

## 2019-10-14 MED ORDER — PROPOFOL 10 MG/ML IV BOLUS
INTRAVENOUS | Status: AC
  Filled 2019-10-14: qty 20

## 2019-10-14 MED ORDER — PROPOFOL 10 MG/ML IV BOLUS
INTRAVENOUS | Status: DC | PRN
  Administered 2019-10-14: 180 mg via INTRAVENOUS

## 2019-10-14 MED ORDER — POVIDONE-IODINE 10 % EX SWAB
2.0000 "application " | Freq: Once | CUTANEOUS | Status: AC
  Administered 2019-10-14: 2 via TOPICAL

## 2019-10-14 MED ORDER — MORPHINE SULFATE (PF) 4 MG/ML IV SOLN
INTRAVENOUS | Status: AC
  Filled 2019-10-14: qty 2

## 2019-10-14 MED ORDER — LACTATED RINGERS IV SOLN
INTRAVENOUS | Status: DC
  Administered 2019-10-14: 14:00:00 via INTRAVENOUS

## 2019-10-14 MED ORDER — LIDOCAINE 2% (20 MG/ML) 5 ML SYRINGE
INTRAMUSCULAR | Status: AC
  Filled 2019-10-14: qty 5

## 2019-10-14 MED ORDER — CHLORHEXIDINE GLUCONATE 4 % EX LIQD: 60.0000 mL | Freq: Once | CUTANEOUS | Status: DC

## 2019-10-14 MED ORDER — SCOPOLAMINE 1 MG/3DAYS TD PT72: 1.0000 | MEDICATED_PATCH | TRANSDERMAL | Status: DC

## 2019-10-14 MED ORDER — SCOPOLAMINE 1 MG/3DAYS TD PT72
MEDICATED_PATCH | TRANSDERMAL | Status: AC
  Administered 2019-10-14: 1.5 mg via TRANSDERMAL
  Filled 2019-10-14: qty 1

## 2019-10-14 MED ORDER — ACETAMINOPHEN 500 MG PO TABS: 1000.0000 mg | ORAL_TABLET | Freq: Once | ORAL | Status: AC

## 2019-10-14 MED ORDER — PHENYLEPHRINE 40 MCG/ML (10ML) SYRINGE FOR IV PUSH (FOR BLOOD PRESSURE SUPPORT)
PREFILLED_SYRINGE | INTRAVENOUS | Status: AC
  Filled 2019-10-14: qty 10

## 2019-10-14 MED ORDER — FENTANYL CITRATE (PF) 100 MCG/2ML IJ SOLN
25.0000 ug | INTRAMUSCULAR | Status: DC | PRN
  Administered 2019-10-14 (×2): 50 ug via INTRAVENOUS

## 2019-10-14 MED ORDER — MIDAZOLAM HCL 2 MG/2ML IJ SOLN
INTRAMUSCULAR | Status: AC
  Filled 2019-10-14: qty 2

## 2019-10-14 MED ORDER — POTASSIUM CHLORIDE 10 MEQ/100ML IV SOLN: 10.0000 meq | INTRAVENOUS | Status: DC

## 2019-10-14 MED ORDER — FENTANYL CITRATE (PF) 100 MCG/2ML IJ SOLN
INTRAMUSCULAR | Status: DC | PRN
  Administered 2019-10-14: 25 ug via INTRAVENOUS
  Administered 2019-10-14: 50 ug via INTRAVENOUS
  Administered 2019-10-14: 25 ug via INTRAVENOUS

## 2019-10-14 MED ORDER — PROMETHAZINE HCL 25 MG/ML IJ SOLN: 6.2500 mg | INTRAMUSCULAR | Status: DC | PRN

## 2019-10-14 MED ORDER — DEXAMETHASONE SODIUM PHOSPHATE 10 MG/ML IJ SOLN
INTRAMUSCULAR | Status: DC | PRN
  Administered 2019-10-14: 10 mg via INTRAVENOUS

## 2019-10-14 MED ORDER — FENTANYL CITRATE (PF) 100 MCG/2ML IJ SOLN: 100.0000 ug | Freq: Once | INTRAMUSCULAR | Status: AC

## 2019-10-14 MED ORDER — MORPHINE SULFATE (PF) 4 MG/ML IV SOLN
INTRAVENOUS | Status: DC | PRN
  Administered 2019-10-14: 8 mg via SUBCUTANEOUS

## 2019-10-14 MED ORDER — MIDAZOLAM HCL 2 MG/2ML IJ SOLN: 2.0000 mg | Freq: Once | INTRAMUSCULAR | Status: AC

## 2019-10-14 MED ORDER — BUPIVACAINE HCL (PF) 0.25 % IJ SOLN
INTRAMUSCULAR | Status: DC | PRN
  Administered 2019-10-14: 10 mL

## 2019-10-14 MED ORDER — CELECOXIB 200 MG PO CAPS
ORAL_CAPSULE | ORAL | Status: AC
  Administered 2019-10-14: 400 mg via ORAL
  Filled 2019-10-14: qty 2

## 2019-10-14 MED ORDER — ROPIVACAINE HCL 7.5 MG/ML IJ SOLN
INTRAMUSCULAR | Status: DC | PRN
  Administered 2019-10-14: 20 mL via PERINEURAL

## 2019-10-14 MED ORDER — POTASSIUM CHLORIDE 10 MEQ/100ML IV SOLN
10.0000 meq | INTRAVENOUS | Status: AC
  Administered 2019-10-14 (×2): 10 meq via INTRAVENOUS
  Filled 2019-10-14 (×2): qty 100

## 2019-10-14 MED ORDER — LIDOCAINE 2% (20 MG/ML) 5 ML SYRINGE
INTRAMUSCULAR | Status: DC | PRN
  Administered 2019-10-14: 60 mg via INTRAVENOUS

## 2019-10-14 MED ORDER — CEFAZOLIN SODIUM-DEXTROSE 2-4 GM/100ML-% IV SOLN
2.0000 g | INTRAVENOUS | Status: AC
  Administered 2019-10-14: 2 g via INTRAVENOUS
  Filled 2019-10-14: qty 100

## 2019-10-14 SURGICAL SUPPLY — 74 items
BANDAGE ESMARK 6X9 LF (GAUZE/BANDAGES/DRESSINGS) ×1 IMPLANT
BIT DRILL 2.9 CANN QC NONSTRL (BIT) ×2 IMPLANT
BIT DRILL 70X3.5XCANN (BIT) IMPLANT
BIT DRILL CANN 3.5 (BIT) ×3
BIT DRL 70X3.5XCANN (BIT) ×1
BLADE CLIPPER SURG (BLADE) IMPLANT
BNDG CMPR 9X6 STRL LF SNTH (GAUZE/BANDAGES/DRESSINGS) ×1
BNDG COHESIVE 4X5 TAN STRL (GAUZE/BANDAGES/DRESSINGS) ×3 IMPLANT
BNDG ELASTIC 4X5.8 VLCR STR LF (GAUZE/BANDAGES/DRESSINGS) ×1 IMPLANT
BNDG ELASTIC 6X5.8 VLCR STR LF (GAUZE/BANDAGES/DRESSINGS) ×6 IMPLANT
BNDG ESMARK 6X9 LF (GAUZE/BANDAGES/DRESSINGS) ×3
CLOSURE WOUND 1/2 X4 (GAUZE/BANDAGES/DRESSINGS) ×1
COVER SURGICAL LIGHT HANDLE (MISCELLANEOUS) ×3 IMPLANT
COVER WAND RF STERILE (DRAPES) ×1 IMPLANT
CUFF TOURN SGL QUICK 34 (TOURNIQUET CUFF) ×3
CUFF TOURN SGL QUICK 42 (TOURNIQUET CUFF) IMPLANT
CUFF TRNQT CYL 34X4.125X (TOURNIQUET CUFF) IMPLANT
DECANTER SPIKE VIAL GLASS SM (MISCELLANEOUS) IMPLANT
DRAPE C-ARM 42X72 X-RAY (DRAPES) ×3 IMPLANT
DRSG ADAPTIC 3X8 NADH LF (GAUZE/BANDAGES/DRESSINGS) IMPLANT
DRSG AQUACEL AG ADV 3.5X10 (GAUZE/BANDAGES/DRESSINGS) ×2 IMPLANT
DRSG EMULSION OIL 3X3 NADH (GAUZE/BANDAGES/DRESSINGS) ×1 IMPLANT
DRSG PAD ABDOMINAL 8X10 ST (GAUZE/BANDAGES/DRESSINGS) IMPLANT
ELECT REM PT RETURN 9FT ADLT (ELECTROSURGICAL) ×3
ELECTRODE REM PT RTRN 9FT ADLT (ELECTROSURGICAL) ×1 IMPLANT
GLOVE BIO SURGEON STRL SZ8 (GLOVE) ×6 IMPLANT
GLOVE BIOGEL PI IND STRL 8 (GLOVE) ×1 IMPLANT
GLOVE BIOGEL PI INDICATOR 8 (GLOVE) ×2
GOWN STRL REUS W/ TWL LRG LVL3 (GOWN DISPOSABLE) IMPLANT
GOWN STRL REUS W/ TWL XL LVL3 (GOWN DISPOSABLE) ×2 IMPLANT
GOWN STRL REUS W/TWL LRG LVL3 (GOWN DISPOSABLE)
GOWN STRL REUS W/TWL XL LVL3 (GOWN DISPOSABLE) ×6
IMMOBILIZER KNEE 20 (SOFTGOODS) ×3
IMMOBILIZER KNEE 20 THIGH 36 (SOFTGOODS) ×1 IMPLANT
K-WIRE ACE 1.6X6 (WIRE) ×6
KIT BASIN OR (CUSTOM PROCEDURE TRAY) ×3 IMPLANT
KIT TURNOVER KIT B (KITS) ×3 IMPLANT
KWIRE ACE 1.6X6 (WIRE) ×2 IMPLANT
MANIFOLD NEPTUNE II (INSTRUMENTS) ×1 IMPLANT
NEEDLE 22X1 1/2 (OR ONLY) (NEEDLE) ×2 IMPLANT
NS IRRIG 1000ML POUR BTL (IV SOLUTION) ×3 IMPLANT
PACK ORTHO EXTREMITY (CUSTOM PROCEDURE TRAY) ×3 IMPLANT
PAD ARMBOARD 7.5X6 YLW CONV (MISCELLANEOUS) ×6 IMPLANT
PAD CAST 4YDX4 CTTN HI CHSV (CAST SUPPLIES) ×2 IMPLANT
PADDING CAST COTTON 4X4 STRL (CAST SUPPLIES) ×6
PASSER SUT SWANSON 36MM LOOP (INSTRUMENTS) ×3 IMPLANT
RETRIEVER SUT HEWSON (MISCELLANEOUS) ×12 IMPLANT
SCREW CANN THRD 3.5X34 (Screw) ×2 IMPLANT
SCREW CANN THRD 3.5X36 (Screw) ×2 IMPLANT
SCREW CANN THRD 3.5X42 (Screw) ×2 IMPLANT
SPONGE LAP 4X18 RFD (DISPOSABLE) ×4 IMPLANT
STAPLER VISISTAT 35W (STAPLE) ×1 IMPLANT
STOCKINETTE IMPERVIOUS 9X36 MD (GAUZE/BANDAGES/DRESSINGS) ×3 IMPLANT
STRIP CLOSURE SKIN 1/2X4 (GAUZE/BANDAGES/DRESSINGS) ×1 IMPLANT
SUCTION FRAZIER HANDLE 10FR (MISCELLANEOUS) ×2
SUCTION TUBE FRAZIER 10FR DISP (MISCELLANEOUS) ×1 IMPLANT
SUT BROADBAND TAPE 2PK 1.5 (SUTURE) ×2 IMPLANT
SUT FIBERWIRE #2 38 REV NDL BL (SUTURE)
SUT MNCRL AB 3-0 PS2 18 (SUTURE) ×4 IMPLANT
SUT STEEL 5 V 56 M (SUTURE) ×1 IMPLANT
SUT VIC AB 0 CT1 27 (SUTURE) ×12
SUT VIC AB 0 CT1 27XBRD ANBCTR (SUTURE) ×1 IMPLANT
SUT VIC AB 2-0 CT1 27 (SUTURE) ×6
SUT VIC AB 2-0 CT1 TAPERPNT 27 (SUTURE) ×2 IMPLANT
SUTURE FIBERWR#2 38 REV NDL BL (SUTURE) IMPLANT
SYR 50ML LL SCALE MARK (SYRINGE) ×3 IMPLANT
SYR CONTROL 10ML LL (SYRINGE) IMPLANT
TOWEL GREEN STERILE (TOWEL DISPOSABLE) ×3 IMPLANT
TOWEL GREEN STERILE FF (TOWEL DISPOSABLE) ×6 IMPLANT
TUBE CONNECTING 12'X1/4 (SUCTIONS) ×1
TUBE CONNECTING 12X1/4 (SUCTIONS) ×2 IMPLANT
UNDERPAD 30X30 (UNDERPADS AND DIAPERS) ×3 IMPLANT
WATER STERILE IRR 1000ML POUR (IV SOLUTION) ×3 IMPLANT
YANKAUER SUCT BULB TIP NO VENT (SUCTIONS) IMPLANT

## 2019-10-14 NOTE — Anesthesia Procedure Notes (Addendum)
Anesthesia Regional Block: Adductor canal block   Pre-Anesthetic Checklist: ,, timeout performed, Correct Patient, Correct Site, Correct Laterality, Correct Procedure, Correct Position, site marked, Risks and benefits discussed,  Surgical consent,  Pre-op evaluation,  At surgeon's request and post-op pain management  Laterality: Left  Prep: chloraprep       Needles:  Injection technique: Single-shot  Needle Type: Stimulator Needle - 80     Needle Length: 10cm  Needle Gauge: 21     Additional Needles:   Narrative:  Start time: 10/14/2019 3:39 PM End time: 10/14/2019 3:49 PM Injection made incrementally with aspirations every 5 mL.  Performed by: Personally

## 2019-10-14 NOTE — Progress Notes (Signed)
Gwenlyn Fudge RN received phone call from lab about critical potassium of 2.7.  At time of phone call patient had already been discharged home from PACU.  Review of chart indicated that patient takes 20 MEQ potassium tablet daily.

## 2019-10-14 NOTE — Anesthesia Postprocedure Evaluation (Signed)
Anesthesia Post Note  Patient: Kristy Yang  Procedure(s) Performed: OPEN REDUCTION INTERNAL (ORIF) FIXATION LEFT PATELLA (Left Knee)     Patient location during evaluation: PACU Anesthesia Type: General Level of consciousness: awake and alert Pain management: pain level controlled Vital Signs Assessment: post-procedure vital signs reviewed and stable Respiratory status: spontaneous breathing, nonlabored ventilation and respiratory function stable Cardiovascular status: blood pressure returned to baseline and stable Postop Assessment: no apparent nausea or vomiting Anesthetic complications: no    Last Vitals:  Vitals:   10/14/19 1900 10/14/19 1915  BP: (!) 135/94 128/90  Pulse: 72 80  Resp: 13 10  Temp:  36.5 C  SpO2: 95% 92%    Last Pain:  Vitals:   10/14/19 1915  PainSc: [place] Edward Jon Lall

## 2019-10-14 NOTE — Brief Op Note (Signed)
   10/14/2019  6:56 PM  PATIENT:  Kristy Yang  68 y.o. female  PRE-OPERATIVE DIAGNOSIS:  left patella fracture  POST-OPERATIVE DIAGNOSIS:  left patella fracture  PROCEDURE:  Procedure(s): OPEN REDUCTION INTERNAL (ORIF) FIXATION LEFT PATELLA  SURGEON:  Surgeon(s): Marlou Sa, Tonna Corner, MD  ASSISTANT: Laure Kidney rnfa  ANESTHESIA:   general  EBL: 25 ml    Total I/O In: 700 [I.V.:700] Out: 10 [Blood:10]  BLOOD ADMINISTERED: none  DRAINS: none   LOCAL MEDICATIONS USED:  none  SPECIMEN:  No Specimen  COUNTS:  YES  TOURNIQUET:   Total Tourniquet Time Documented: Thigh (Left) - 36 minutes Total: Thigh (Left) - 36 minutes   DICTATION: .Other Dictation: Dictation Number [id number]  PLAN OF CARE: Discharge to home after PACU  PATIENT DISPOSITION:  PACU - hemodynamically stable

## 2019-10-14 NOTE — Transfer of Care (Signed)
Immediate Anesthesia Transfer of Care Note  Patient: Kristy Yang  Procedure(s) Performed: OPEN REDUCTION INTERNAL (ORIF) FIXATION LEFT PATELLA (Left Knee)  Patient Location: PACU  Anesthesia Type:GA combined with regional for post-op pain  Level of Consciousness: awake, alert  and oriented  Airway & Oxygen Therapy: Patient Spontanous Breathing and Patient connected to nasal cannula oxygen  Post-op Assessment: Report given to RN and Post -op Vital signs reviewed and stable  Post vital signs: Reviewed and stable  Last Vitals:  Vitals Value Taken Time  BP 135/82 10/14/19 1815  Temp    Pulse 80 10/14/19 1817  Resp 12 10/14/19 1817  SpO2 94 % 10/14/19 1817  Vitals shown include unvalidated device data.  Last Pain:  Vitals:   10/14/19 1416  PainSc: 4       Patients Stated Pain Goal: 4 ([id number])  Complications: No apparent anesthesia complications

## 2019-10-14 NOTE — Progress Notes (Addendum)
CRITICAL VALUE ALERT  Critical Value:  Potassium 2.7  Date & Time Notied:  10/14/19 2:57 PM   Provider Notified: Dr. Tobias Alexander 10/14/19 2:57 PM and Dr Marlou Sa at 1500  Orders Received/Actions taken: Dr. Marlou Sa made aware, order received for 2 runs of potassium, redraw potassium lab after second run

## 2019-10-14 NOTE — H&P (Signed)
Kristy Yang is an 68 y.o. female.   Chief Complaint: Left patella fracture HPI: Patient presents with left patella fracture.  She sustained this within a week.  This occurred after a fall onto a hard concrete surface.  Radiographs show 3 mm displaced patella fracture.  She presents now for operative management in order to minimize time out of work and to facilitate quicker recovery.  No personal or family history of DVT or pulmonary embolism.  Past Medical History:  Diagnosis Date  . Anxiety   . Cataracts, bilateral   . Depression   . GERD (gastroesophageal reflux disease)   . Grave's disease    on no medications at this time  . Heart murmur    no problems  . Hyperlipidemia   . Hypertension   . Pneumonia    as a baby    Past Surgical History:  Procedure Laterality Date  . CATARACT EXTRACTION  2010   bilateral  . COLONOSCOPY  2006  . PARTIAL HYSTERECTOMY    . POLYPECTOMY      Family History  Problem Relation Age of Onset  . Colon cancer Sister   . Colon cancer Maternal Aunt   . Diabetes Mother   . Diabetes Father   . Stroke Maternal Grandmother   . Rectal cancer Neg Hx   . Stomach cancer Neg Hx   . Esophageal cancer Neg Hx    Social History:  reports that she quit smoking about 27 years ago. She has never used smokeless tobacco. She reports that she does not drink alcohol or use drugs.  Allergies: No Known Allergies  Facility-Administered Medications Prior to Admission  Medication Dose Route Frequency Provider Last Rate Last Admin  . 0.9 %  sodium chloride infusion  500 mL Intravenous Continuous Nelida Meuse III, MD       Medications Prior to Admission  Medication Sig Dispense Refill  . amLODipine (NORVASC) 10 MG tablet Take 1 tablet (10 mg total) by mouth daily. 90 tablet 1  . atorvastatin (LIPITOR) 20 MG tablet Take 20 mg by mouth daily.    . chlorthalidone (HYGROTON) 50 MG tablet Take 50 mg by mouth daily.    . DULoxetine (CYMBALTA) 30 MG capsule Take 30  mg by mouth 2 (two) times daily.    Marland Kitchen HYDROcodone-acetaminophen (NORCO/VICODIN) 5-325 MG tablet Take 1 tablet by mouth every 6 (six) hours as needed. (Patient taking differently: Take 1 tablet by mouth every 6 (six) hours as needed for moderate pain. ) 30 tablet 0  . ibuprofen (ADVIL) 200 MG tablet Take 400 mg by mouth every 6 (six) hours as needed for headache or moderate pain.    Marland Kitchen omeprazole (PRILOSEC) 20 MG capsule Take 20 mg by mouth daily as needed (heart burn).     . potassium chloride SA (KLOR-CON M20) 20 MEQ tablet Take 1 tablet (20 mEq total) by mouth daily. 90 tablet 1  . vitamin E 400 UNIT capsule Take 400 Units by mouth daily.    Marland Kitchen aspirin EC 81 MG tablet Take 1 tablet (81 mg total) by mouth daily. 28 tablet 0  . hydrochlorothiazide (HYDRODIURIL) 25 MG tablet Take 1 tablet (25 mg total) by mouth daily. PATIENT NEEDS OFFICE VISIT FOR ADDITIONAL REFILLS (Patient not taking: Reported on 10/11/2019) 30 tablet 0  . methocarbamol (ROBAXIN) 500 MG tablet Take 1 tablet (500 mg total) by mouth every 8 (eight) hours as needed. 30 tablet 0  . metoprolol succinate (TOPROL-XL) 50 MG 24 hr tablet  Take 1 tablet (50 mg total) by mouth daily. PATIENT NEEDS OFFICE VISIT FOR ADDITIONAL REFILLS (Patient not taking: Reported on 10/11/2019) 30 tablet 0  . oxyCODONE (ROXICODONE) 5 MG immediate release tablet Take 1 tablet (5 mg total) by mouth every 4 (four) hours as needed. 40 tablet 0    Results for orders placed or performed during the hospital encounter of 10/14/19 (from the past 48 hour(s))  CBC     Status: Abnormal   Collection Time: 10/14/19  1:42 PM  Result Value Ref Range   WBC 4.5 4.0 - 10.5 K/uL   RBC 3.87 3.87 - 5.11 MIL/uL   Hemoglobin 11.7 (L) 12.0 - 15.0 g/dL   HCT 34.9 (L) 36.0 - 46.0 %   MCV 90.2 80.0 - 100.0 fL   MCH 30.2 26.0 - 34.0 pg   MCHC 33.5 30.0 - 36.0 g/dL   RDW 12.7 11.5 - 15.5 %   Platelets 314 150 - 400 K/uL   nRBC 0.0 0.0 - 0.2 %    Comment: Performed at [facility], [place] [address] [place], [place] [id number]  Basic metabolic panel     Status: Abnormal   Collection Time: 10/14/19  1:42 PM  Result Value Ref Range   Sodium 138 135 - 145 mmol/L   Potassium 2.7 (LL) 3.5 - 5.1 mmol/L    Comment: CRITICAL RESULT CALLED TO, READ BACK BY AND VERIFIED WITH: J.HAZLIP,RN 1457 10/14/2019 CLARK,S    Chloride 97 (L) 98 - 111 mmol/L   CO2 31 22 - 32 mmol/L   Glucose, Bld 110 (H) 70 - 99 mg/dL   BUN 15 8 - 23 mg/dL   Creatinine, Ser 0.69 0.44 - 1.00 mg/dL   Calcium 9.3 8.9 - 10.3 mg/dL   GFR calc non Af Amer >60 >60 mL/min   GFR calc Af Amer >60 >60 mL/min   Anion gap 10 5 - 15    Comment: Performed at [facility], [place] [address], [place], [place] [address]   No results found.  Review of Systems  Musculoskeletal: Positive for joint swelling.  All other systems reviewed and are negative.   Blood pressure 112/66, pulse 73, temperature 99 F (37.2 C), resp. rate 12, height 5\' 2"  (1.575 m), weight 78 kg, SpO2 100 %. Physical Exam  Constitutional: She appears well-developed.  HENT:  Head: Normocephalic.  Eyes: Pupils are equal, round, and reactive to light.  Cardiovascular: Normal rate.  Respiratory: Effort normal.  Musculoskeletal:     Cervical back: Normal range of motion.  Neurological: She is alert.  Skin: Skin is warm.  Psychiatric: She has a normal mood and affect.  Examination of the left knee demonstrates intact skin with swelling in the joint.  Patient has soft compartments and negative Homans test.  Pedal pulses palpable on the left-hand side.  Extensor mechanism does show a lag with attempted extension.  Assessment/Plan Impression is displaced patella fracture with more of a distraction as opposed to step-off type injury.  Plan is open reduction internal fixation using clamps with 2 cannulated screws and tapes.  Risk and benefits of the procedure discussed include not limited to infection nerve vessel damage nonunion malunion as  well as potential need for hardware removal.  Patient understands risk benefits.  All questions answered  Anderson Malta, MD 10/14/2019, 4:19 PM

## 2019-10-14 NOTE — Discharge Instructions (Signed)
Okay for partial weightbearing with crutches and the knee immobilizer You should keep the knee immobilizer on at all times except when you are showering Okay to remove Ace wrap tomorrow.  Dressing below is waterproof okay to shower with this dressing Come back to clinic next week.  Use pain medicine and muscle relaxers but also take 1 baby aspirin twice a day for blood clot prevention

## 2019-10-14 NOTE — Op Note (Signed)
NAMETAIA, BACH MEDICAL RECORD [id number] ACCOUNT [id number] DATE OF BIRTH:December 08, 1950 FACILITY: [facility] LOCATION: [facility] PHYSICIAN:Wilmore Holsomback Randel Pigg, MD  OPERATIVE REPORT  DATE OF PROCEDURE:  10/14/2019  PREOPERATIVE DIAGNOSIS:  Left knee patellar fracture.  POSTOPERATIVE DIAGNOSIS:  Left knee patellar fracture.  PROCEDURE:  Left knee patellar fracture open reduction internal fixation.  SURGEON:  Meredith Pel, MD  ASSISTANT:  Laure Kidney, RNFA  INDICATIONS:  This is a 68 year old patient with left knee patellar fracture, displaced, which she presents now for operative management after explanation of risks and benefits.  PROCEDURE IN DETAIL:  The patient was brought to the operating room where general endotracheal anesthesia was induced.  Preoperative antibiotics administered.  Timeout was called.  Ioban used to cover the operative field.  Left leg was elevated  exsanguinated with the Esmarch wrap.  Tourniquet was inflated.  Total tourniquet time 36 minutes.  Anterior approach to the patella was made.  Skin and subcutaneous tissue were sharply divided.  The 2 reduction clamps were utilized and the fracture was  reduced very nicely.  Two 3.5 cannulated screws were then placed across the patella in good position dividing the patella into thirds.  Biomet suture tapes were then utilized in figure-of-eight fashion to reinforce the repair.  Under fluoroscopic  guidance, the screw length was good and the position was good.  At this time, the tourniquet was released.  Thorough irrigation was performed.  Incision was closed using 0 Vicryl suture, 2-0 Vicryl suture and a 3-0 Monocryl.  The knee was injected with a  solution of Marcaine, morphine, clonidine for postop pain relief.  The patient was placed in a knee immobilizer.  She tolerated the procedure well without immediate complications.  She was transferred to the recovery room in stable condition.  Carla's  assistance was  required for the entire case.  TN/NUANCE  D:10/14/2019 T:10/14/2019 JOB:[id number]

## 2019-10-14 NOTE — Anesthesia Procedure Notes (Signed)
Procedure Name: LMA Insertion Date/Time: 10/14/2019 4:34 PM Performed by: Candis Shine, CRNA Pre-anesthesia Checklist: Patient identified, Emergency Drugs available, Suction available and Patient being monitored Patient Re-evaluated:Patient Re-evaluated prior to induction Oxygen Delivery Method: Circle System Utilized Preoxygenation: Pre-oxygenation with 100% oxygen Induction Type: IV induction LMA: LMA inserted LMA Size: 4.0 Number of attempts: 1 Placement Confirmation: positive ETCO2 Tube secured with: Tape Dental Injury: Teeth and Oropharynx as per pre-operative assessment

## 2019-10-15 ENCOUNTER — Encounter: Payer: Self-pay | Admitting: *Deleted

## 2019-10-15 NOTE — Addendum Note (Signed)
Addendum  created 10/15/19 1632 by Duane Boston, MD   Clinical Note Signed, Intraprocedure Blocks edited

## 2019-10-22 ENCOUNTER — Encounter: Payer: Self-pay | Admitting: Orthopedic Surgery

## 2019-10-22 ENCOUNTER — Telehealth: Payer: Self-pay | Admitting: Radiology

## 2019-10-22 ENCOUNTER — Ambulatory Visit (INDEPENDENT_AMBULATORY_CARE_PROVIDER_SITE_OTHER): Payer: Worker's Compensation

## 2019-10-22 ENCOUNTER — Ambulatory Visit (HOSPITAL_COMMUNITY)
Admission: RE | Admit: 2019-10-22 | Discharge: 2019-10-22 | Disposition: A | Discharge status: Home / Self Care | Payer: Worker's Compensation | Source: Ambulatory Visit | Attending: Orthopedic Surgery | Admitting: Orthopedic Surgery

## 2019-10-22 ENCOUNTER — Ambulatory Visit (INDEPENDENT_AMBULATORY_CARE_PROVIDER_SITE_OTHER): Payer: Worker's Compensation | Admitting: Orthopedic Surgery

## 2019-10-22 ENCOUNTER — Other Ambulatory Visit: Payer: Self-pay

## 2019-10-22 VITALS — Ht 62.0 in | Wt 171.0 lb

## 2019-10-22 DIAGNOSIS — M25562 Pain in left knee: Secondary | ICD-10-CM

## 2019-10-22 MED ORDER — OXYCODONE HCL 5 MG PO TABS: ORAL_TABLET | ORAL | 0 refills | Status: DC

## 2019-10-22 MED ORDER — METHOCARBAMOL 500 MG PO TABS: 500.0000 mg | ORAL_TABLET | Freq: Three times a day (TID) | ORAL | 0 refills | Status: DC | PRN

## 2019-10-22 NOTE — Telephone Encounter (Signed)
Noted, thanks!

## 2019-10-22 NOTE — Progress Notes (Signed)
Post-Op Visit Note   Patient: Kristy Yang           Date of Birth: Sep 30, 1951           MRN: [id number] Visit Date: 10/22/2019 PCP: Lauraine Rinne, MD   Assessment & Plan:  Chief Complaint:  Chief Complaint  Patient presents with  . Left Knee - Routine Post Op    10/14/2019 ORIF Left Patella   Visit Diagnoses:  1. Acute pain of left knee     Plan: Patient is now about 9 days out left knee patella fracture fixation.  On exam the incisions intact.  She has negative Homans and fairly minimal swelling around the calf but some mild tenderness to palpation around the knee.  Extensor mechanism is intact.  She has been taking aspirin for DVT prophylaxis.  Mild effusion is present.  X-rays look good.  Plan is home health physical therapy for quad strengthening straight leg raises.  Okay to be weightbearing as tolerated in the knee immobilizer.  Refill oxycodone and Robaxin.  2-week return.  She will be out of work for the next 2 weeks.  Likely about 10 weeks total out of work from this point in time possibly more.  I do think fixation is stable.  Like to get ultrasound next week just to make sure she does not have a blood clot.  Symptoms are not really overwhelming today but she has slightly mildly tender mid calf region.  We will get that set up for next week.  Follow-Up Instructions: Return in about 2 weeks (around 11/05/2019).   Orders:  Orders Placed This Encounter  Procedures  . XR Knee 1-2 Views Left  . Ambulatory referral to Physical Therapy  . VAS [place] LOWER EXTREMITY VENOUS (DVT)   Meds ordered this encounter  Medications  . oxyCODONE (ROXICODONE) 5 MG immediate release tablet    Sig: 1 po q 8 hrs prn pain    Dispense:  35 tablet    Refill:  0  . methocarbamol (ROBAXIN) 500 MG tablet    Sig: Take 1 tablet (500 mg total) by mouth every 8 (eight) hours as needed.    Dispense:  30 tablet    Refill:  0    Imaging: XR Knee 1-2 Views Left  Result Date: 10/22/2019 AP  lateral left knee demonstrates patella fracture in good position alignment with 2 transfixing screws across the fracture site.  Fracture displacement is approximately 1/2 mm with no step-off.  No other fractures present.  Mild degenerative changes noted in the medial lateral compartments without significant osteophytosis.  VAS [place] LOWER EXTREMITY VENOUS (DVT)  Result Date: 10/22/2019  Lower Venous Study Indications: Pain, and post knee surgery.  Limitations: Bandages and orthopaedic appliance. Comparison Study: No prior study Performing Technologist: Maudry Mayhew MHA, RDMS, RVT, RDCS  Examination Guidelines: A complete evaluation includes B-mode imaging, spectral Doppler, color Doppler, and power Doppler as needed of all accessible portions of each vessel. Bilateral testing is considered an integral part of a complete examination. Limited examinations for reoccurring indications may be performed as noted.  +---------+---------------+---------+-----------+----------+--------------+ LEFT     CompressibilityPhasicitySpontaneityPropertiesThrombus Aging +---------+---------------+---------+-----------+----------+--------------+ CFV      Full           Yes      Yes                                 +---------+---------------+---------+-----------+----------+--------------+ SFJ  Full                                                        +---------+---------------+---------+-----------+----------+--------------+ FV Prox  Full                                                        +---------+---------------+---------+-----------+----------+--------------+ FV Mid   Full                                                        +---------+---------------+---------+-----------+----------+--------------+ FV DistalFull                                                        +---------+---------------+---------+-----------+----------+--------------+ PFV      Full                                                         +---------+---------------+---------+-----------+----------+--------------+ POP      Full           Yes      Yes                                 +---------+---------------+---------+-----------+----------+--------------+ PTV      Full                                                        +---------+---------------+---------+-----------+----------+--------------+ PERO     Full                                                        +---------+---------------+---------+-----------+----------+--------------+     Summary: Left: There is no evidence of deep vein thrombosis in the lower extremity. No cystic structure found in the popliteal fossa.  *See table(s) above for measurements and observations.    Preliminary     PMFS History: Patient Active Problem List   Diagnosis Date Noted  . HTN (hypertension) 06/16/2012  . Hypothyroid 06/16/2012  . Goiter 06/16/2012   Past Medical History:  Diagnosis Date  . Anxiety   . Cataracts, bilateral   . Depression   . GERD (gastroesophageal reflux disease)   . Grave's disease    on no medications at this time  . Heart murmur    no problems  .  Hyperlipidemia   . Hypertension   . Pneumonia    as a baby    Family History  Problem Relation Age of Onset  . Colon cancer Sister   . Colon cancer Maternal Aunt   . Diabetes Mother   . Diabetes Father   . Stroke Maternal Grandmother   . Rectal cancer Neg Hx   . Stomach cancer Neg Hx   . Esophageal cancer Neg Hx     Past Surgical History:  Procedure Laterality Date  . CATARACT EXTRACTION  2010   bilateral  . COLONOSCOPY  2006  . ORIF PATELLA Left 10/14/2019   Procedure: OPEN REDUCTION INTERNAL (ORIF) FIXATION LEFT PATELLA;  Surgeon: Meredith Pel, MD;  Location: [facility];  Service: Orthopedics;  Laterality: Left;  . PARTIAL HYSTERECTOMY    . POLYPECTOMY     Social History   Occupational History  . Not on file  Tobacco Use  .  Smoking status: Former Smoker    Quit date: 07/26/1992    Years since quitting: 27.2  . Smokeless tobacco: Never Used  Substance and Sexual Activity  . Alcohol use: No  . Drug use: No  . Sexual activity: Not on file

## 2019-10-22 NOTE — Progress Notes (Signed)
Disregard prior request thanks

## 2019-10-22 NOTE — Telephone Encounter (Signed)
FYI.Marland Kitchen...  Patient has been scheduled for Left Lower Extremity Doppler, R/O DVT at [facility] at 2pm today. She will arrive to Entrance C off of [address] at 1:45p to register.    Patient and case manager aware of appt.  Case manager approved ultrasound while here.

## 2019-10-22 NOTE — Progress Notes (Signed)
Left lower extremity venous duplex completed. Refer to "CV Proc" under chart review to view preliminary results.  10/22/2019 3:10 PM Kelby Aline., MHA, RVT, RDCS, RDMS

## 2019-10-25 NOTE — Progress Notes (Signed)
This has already been done.

## 2019-11-08 ENCOUNTER — Other Ambulatory Visit: Payer: Self-pay

## 2019-11-08 ENCOUNTER — Ambulatory Visit (INDEPENDENT_AMBULATORY_CARE_PROVIDER_SITE_OTHER): Payer: Worker's Compensation | Admitting: Surgical

## 2019-11-08 ENCOUNTER — Ambulatory Visit (INDEPENDENT_AMBULATORY_CARE_PROVIDER_SITE_OTHER): Payer: Worker's Compensation

## 2019-11-08 ENCOUNTER — Encounter: Payer: Self-pay | Admitting: Surgical

## 2019-11-08 DIAGNOSIS — M25562 Pain in left knee: Secondary | ICD-10-CM | POA: Diagnosis not present

## 2019-11-08 DIAGNOSIS — S82035D Nondisplaced transverse fracture of left patella, subsequent encounter for closed fracture with routine healing: Secondary | ICD-10-CM

## 2019-11-08 NOTE — Progress Notes (Signed)
Post-Op Visit Note   Patient: Kristy Yang           Date of Birth: 11-16-1950           MRN: [id number] Visit Date: 11/08/2019 PCP: Lauraine Rinne, MD   Assessment & Plan:  Chief Complaint:  Chief Complaint  Patient presents with  . Left Knee - Follow-up   Visit Diagnoses:  1. Closed nondisplaced transverse fracture of left patella with routine healing, subsequent encounter   2. Acute pain of left knee     Plan: Patient is a 69 year old female who is about 3 and half weeks out from left knee patellar ORIF.  Patient notes that she is doing better and her pain is improving.  She has been weightbearing with the knee immobilizer and crutches.  She has been doing home health physical therapy 3 times a week to work on passive range of motion of the left knee and strengthening of the quad.  She has been taking oxycodone about twice a day as well as Robaxin.  She has been using aspirin once a day for DVT prophylaxis.  She is remain out of work as well.  Her incision is healing well and she has no calf tenderness on exam.  She has some mild to moderate tenderness to palpation over the fracture site.  Extension is 0 degrees and she is able to easily flex to about 80 degrees.  She flexes further to about 90 degrees but this is more painful.  She had an ultrasound after her last office visit which was negative for DVT.  X-rays taken today reveal no loosening of the hardware in place with radiographic evidence of fracture healing present.  Overall patient is progressing well.  Transition to home health physical therapy 2 times a week to work on passive range of motion of the knee with quadriceps strengthening.  Provided work note to keep patient out of work for another 4 weeks.  Patient will follow up in 3 weeks for clinical recheck.  She will continue to use the knee immobilizer when ambulating but may remove the knee immobilizer plan just sitting around the house or when sleeping.  Follow-Up  Instructions: No follow-ups on file.   Orders:  Orders Placed This Encounter  Procedures  . XR Knee 1-2 Views Left   No orders of the defined types were placed in this encounter.   Imaging: No results found.  PMFS History: Patient Active Problem List   Diagnosis Date Noted  . HTN (hypertension) 06/16/2012  . Hypothyroid 06/16/2012  . Goiter 06/16/2012   Past Medical History:  Diagnosis Date  . Anxiety   . Cataracts, bilateral   . Depression   . GERD (gastroesophageal reflux disease)   . Grave's disease    on no medications at this time  . Heart murmur    no problems  . Hyperlipidemia   . Hypertension   . Pneumonia    as a baby    Family History  Problem Relation Age of Onset  . Colon cancer Sister   . Colon cancer Maternal Aunt   . Diabetes Mother   . Diabetes Father   . Stroke Maternal Grandmother   . Rectal cancer Neg Hx   . Stomach cancer Neg Hx   . Esophageal cancer Neg Hx     Past Surgical History:  Procedure Laterality Date  . CATARACT EXTRACTION  2010   bilateral  . COLONOSCOPY  2006  . ORIF PATELLA Left  10/14/2019   Procedure: OPEN REDUCTION INTERNAL (ORIF) FIXATION LEFT PATELLA;  Surgeon: Meredith Pel, MD;  Location: [facility];  Service: Orthopedics;  Laterality: Left;  . PARTIAL HYSTERECTOMY    . POLYPECTOMY     Social History   Occupational History  . Not on file  Tobacco Use  . Smoking status: Former Smoker    Quit date: 07/26/1992    Years since quitting: 27.3  . Smokeless tobacco: Never Used  Substance and Sexual Activity  . Alcohol use: No  . Drug use: No  . Sexual activity: Not on file

## 2019-11-17 ENCOUNTER — Telehealth: Payer: Self-pay | Admitting: Orthopedic Surgery

## 2019-11-17 ENCOUNTER — Other Ambulatory Visit: Payer: Self-pay | Admitting: Surgical

## 2019-11-17 MED ORDER — OXYCODONE HCL 5 MG PO TABS: 5.0000 mg | ORAL_TABLET | Freq: Two times a day (BID) | ORAL | 0 refills | Status: DC | PRN

## 2019-11-17 NOTE — Telephone Encounter (Signed)
Please advise. Thanks.  

## 2019-11-17 NOTE — Telephone Encounter (Signed)
Patient called. She would like a refill on Oxycodone. Her call back number is [phone]

## 2019-11-24 ENCOUNTER — Other Ambulatory Visit: Payer: Self-pay

## 2019-11-24 ENCOUNTER — Ambulatory Visit (INDEPENDENT_AMBULATORY_CARE_PROVIDER_SITE_OTHER): Payer: Worker's Compensation | Admitting: Orthopedic Surgery

## 2019-11-24 ENCOUNTER — Encounter: Payer: Self-pay | Admitting: Orthopedic Surgery

## 2019-11-24 DIAGNOSIS — S82035D Nondisplaced transverse fracture of left patella, subsequent encounter for closed fracture with routine healing: Secondary | ICD-10-CM

## 2019-11-24 NOTE — Progress Notes (Signed)
   Post-Op Visit Note   Patient: Kristy Yang           Date of Birth: Oct 10, 1951           MRN: [id number] Visit Date: 11/24/2019 PCP: Lauraine Rinne, MD   Assessment & Plan:  Chief Complaint:  Chief Complaint  Patient presents with  . Left Knee - Pain   Visit Diagnoses:  1. Closed nondisplaced transverse fracture of left patella with routine healing, subsequent encounter     Plan: Patient presents follow-up left patella fracture open reduction internal fixation.  She is now about 6 weeks out.  On exam she has diminished quad strength but her quad does fire.  No effusion in the knee.  She can flex to about 60 degrees.  I Georgina Peer put her in a hinged knee brace weightbearing as tolerated with crutches.  Outpatient therapy to start to really work on range of motion to 90 as well as quad strengthening.  Out of work at least 4 more weeks.  Come back in 2 weeks for clinical recheck.  No calf tenderness negative Homans today.  Case manager also involved in decision-making.  Her name is Chiropractor.  Follow-Up Instructions: Return in about 2 weeks (around 12/08/2019).   Orders:  Orders Placed This Encounter  Procedures  . Ambulatory referral to Physical Therapy   No orders of the defined types were placed in this encounter.   Imaging: No results found.  PMFS History: Patient Active Problem List   Diagnosis Date Noted  . HTN (hypertension) 06/16/2012  . Hypothyroid 06/16/2012  . Goiter 06/16/2012   Past Medical History:  Diagnosis Date  . Anxiety   . Cataracts, bilateral   . Depression   . GERD (gastroesophageal reflux disease)   . Grave's disease    on no medications at this time  . Heart murmur    no problems  . Hyperlipidemia   . Hypertension   . Pneumonia    as a baby    Family History  Problem Relation Age of Onset  . Colon cancer Sister   . Colon cancer Maternal Aunt   . Diabetes Mother   . Diabetes Father   . Stroke Maternal Grandmother   . Rectal cancer  Neg Hx   . Stomach cancer Neg Hx   . Esophageal cancer Neg Hx     Past Surgical History:  Procedure Laterality Date  . CATARACT EXTRACTION  2010   bilateral  . COLONOSCOPY  2006  . ORIF PATELLA Left 10/14/2019   Procedure: OPEN REDUCTION INTERNAL (ORIF) FIXATION LEFT PATELLA;  Surgeon: Meredith Pel, MD;  Location: [facility];  Service: Orthopedics;  Laterality: Left;  . PARTIAL HYSTERECTOMY    . POLYPECTOMY     Social History   Occupational History  . Not on file  Tobacco Use  . Smoking status: Former Smoker    Quit date: 07/26/1992    Years since quitting: 27.3  . Smokeless tobacco: Never Used  Substance and Sexual Activity  . Alcohol use: No  . Drug use: No  . Sexual activity: Not on file

## 2019-12-10 ENCOUNTER — Ambulatory Visit (INDEPENDENT_AMBULATORY_CARE_PROVIDER_SITE_OTHER): Payer: Worker's Compensation

## 2019-12-10 ENCOUNTER — Encounter: Payer: Self-pay | Admitting: Orthopedic Surgery

## 2019-12-10 ENCOUNTER — Ambulatory Visit (INDEPENDENT_AMBULATORY_CARE_PROVIDER_SITE_OTHER): Payer: Worker's Compensation | Admitting: Orthopedic Surgery

## 2019-12-10 ENCOUNTER — Other Ambulatory Visit: Payer: Self-pay

## 2019-12-10 ENCOUNTER — Telehealth: Payer: Self-pay | Admitting: Orthopedic Surgery

## 2019-12-10 DIAGNOSIS — S82035D Nondisplaced transverse fracture of left patella, subsequent encounter for closed fracture with routine healing: Secondary | ICD-10-CM | POA: Diagnosis not present

## 2019-12-10 NOTE — Progress Notes (Signed)
   Post-Op Visit Note   Patient: Kristy Yang           Date of Birth: 09/15/1951           MRN: [id number] Visit Date: 12/10/2019 PCP: Lauraine Rinne, MD   Assessment & Plan:  Chief Complaint:  Chief Complaint  Patient presents with  . Left Knee - Follow-up   Visit Diagnoses:  1. Closed nondisplaced transverse fracture of left patella with routine healing, subsequent encounter     Plan: Patient presents now 2 months out left patella ORIF.  She is having little quad weakness.  Radiographs show excellent fracture alignment and consolidation with the fracture line not visible.  She is got about a 10 degree extensor lag.  I will have her start doing and continue doing therapy for two more weeks and then work hardening for 2 weeks.  Anticipate possible return to work in 4 weeks.  Taking oxycodone and Robaxin as needed.  No calf tenderness today.  She is using a brace.  Case manager present for discussion of treatment plan.  If possible would like her to do a little bit of blood flow restriction therapy just to get that leg stronger quicker.  Come back in 4 weeks for clinical recheck.  Follow-Up Instructions: Return in about 4 weeks (around 01/07/2020).   Orders:  Orders Placed This Encounter  Procedures  . XR Knee 1-2 Views Left   No orders of the defined types were placed in this encounter.   Imaging: XR Knee 1-2 Views Left  Result Date: 12/10/2019 AP lateral left knee reviewed.  Patella fracture alignment is good.  Fracture line not visible.  No evidence of lucency around the hardware.  No significant arthritis in the medial or lateral compartment.   PMFS History: Patient Active Problem List   Diagnosis Date Noted  . HTN (hypertension) 06/16/2012  . Hypothyroid 06/16/2012  . Goiter 06/16/2012   Past Medical History:  Diagnosis Date  . Anxiety   . Cataracts, bilateral   . Depression   . GERD (gastroesophageal reflux disease)   . Grave's disease    on no  medications at this time  . Heart murmur    no problems  . Hyperlipidemia   . Hypertension   . Pneumonia    as a baby    Family History  Problem Relation Age of Onset  . Colon cancer Sister   . Colon cancer Maternal Aunt   . Diabetes Mother   . Diabetes Father   . Stroke Maternal Grandmother   . Rectal cancer Neg Hx   . Stomach cancer Neg Hx   . Esophageal cancer Neg Hx     Past Surgical History:  Procedure Laterality Date  . CATARACT EXTRACTION  2010   bilateral  . COLONOSCOPY  2006  . ORIF PATELLA Left 10/14/2019   Procedure: OPEN REDUCTION INTERNAL (ORIF) FIXATION LEFT PATELLA;  Surgeon: Meredith Pel, MD;  Location: [facility];  Service: Orthopedics;  Laterality: Left;  . PARTIAL HYSTERECTOMY    . POLYPECTOMY     Social History   Occupational History  . Not on file  Tobacco Use  . Smoking status: Former Smoker    Quit date: 07/26/1992    Years since quitting: 27.3  . Smokeless tobacco: Never Used  Substance and Sexual Activity  . Alcohol use: No  . Drug use: No  . Sexual activity: Not on file

## 2019-12-10 NOTE — Telephone Encounter (Signed)
Worker's Fish farm manager.  Case manager is requesting a return to work note as well as the appointment notes be faxed to her. The patient would also like a copy of those same documents emailed to her.   Fax: [phone]  Email: [email or web address]

## 2019-12-10 NOTE — Telephone Encounter (Signed)
Can you please advise on work status so I can make a new note.

## 2019-12-10 NOTE — Telephone Encounter (Signed)
Note generated. I faxed OV note and work note to number provided.

## 2020-01-07 ENCOUNTER — Ambulatory Visit (INDEPENDENT_AMBULATORY_CARE_PROVIDER_SITE_OTHER): Payer: Worker's Compensation | Admitting: Orthopedic Surgery

## 2020-01-07 ENCOUNTER — Encounter: Payer: Self-pay | Admitting: Orthopedic Surgery

## 2020-01-07 ENCOUNTER — Other Ambulatory Visit: Payer: Self-pay

## 2020-01-07 DIAGNOSIS — S82035D Nondisplaced transverse fracture of left patella, subsequent encounter for closed fracture with routine healing: Secondary | ICD-10-CM

## 2020-01-07 NOTE — Progress Notes (Signed)
Post-Op Visit Note   Patient: Kristy Yang           Date of Birth: September 26, 1951           MRN: [id number] Visit Date: 01/07/2020 PCP: Lauraine Rinne, MD   Assessment & Plan:  Chief Complaint:  Chief Complaint  Patient presents with  . Left Knee - Follow-up   Visit Diagnoses:  1. Closed nondisplaced transverse fracture of left patella with routine healing, subsequent encounter     Plan: Patient presents now about 3 months out left patella fracture open reduction internal fixation.  She has been in therapy.  She is able to do 10 straight leg raises 2 sets.  Still using crutches and is getting ready to transition to a cane.  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  On exam she has flexion to about 110 full extension still has some quad weakness no real effusion.  Plan at this time is 2 weeks more physical therapy 2 weeks of work hardening.  I think she is okay to start 1/2 days of sedentary work in 2 weeks.  Continue with physical therapy for the next month again 2 weeks of therapy but then 2 weeks of work hardening.  All this is discussed with Kristy Yang.  I will see her back in 4 weeks for likely final check.  I think potentially we could discuss and determine a return to full duty unrestricted work status at that time also.  Follow-Up Instructions: Return in about 4 weeks (around 02/04/2020).   Orders:  No orders of the defined types were placed in this encounter.  No orders of the defined types were placed in this encounter.   Imaging: No results found.  PMFS History: Patient Active Problem List   Diagnosis Date Noted  . HTN (hypertension) 06/16/2012  . Hypothyroid 06/16/2012  . Goiter 06/16/2012   Past Medical History:  Diagnosis Date  . Anxiety   . Cataracts, bilateral   . Depression   . GERD (gastroesophageal reflux disease)   . Grave's disease    on no medications at this time  . Heart  murmur    no problems  . Hyperlipidemia   . Hypertension   . Pneumonia    as a baby    Family History  Problem Relation Age of Onset  . Colon cancer Sister   . Colon cancer Maternal Aunt   . Diabetes Mother   . Diabetes Father   . Stroke Maternal Grandmother   . Rectal cancer Neg Hx   . Stomach cancer Neg Hx   . Esophageal cancer Neg Hx     Past Surgical History:  Procedure Laterality Date  . CATARACT EXTRACTION  2010   bilateral  . COLONOSCOPY  2006  . ORIF PATELLA Left 10/14/2019   Procedure: OPEN REDUCTION INTERNAL (ORIF) FIXATION LEFT PATELLA;  Surgeon: Meredith Pel, MD;  Location: [facility];  Service: Orthopedics;  Laterality: Left;  . PARTIAL HYSTERECTOMY    . POLYPECTOMY     Social History   Occupational History  . Not on file  Tobacco Use  . Smoking status: Former Smoker    Quit date: 07/26/1992    Years since quitting: 27.4  . Smokeless tobacco: Never Used  Substance and Sexual Activity  . Alcohol use: No  . Drug use: No  . Sexual activity: Not on file

## 2020-02-07 ENCOUNTER — Telehealth: Payer: Self-pay | Admitting: Orthopedic Surgery

## 2020-02-07 ENCOUNTER — Ambulatory Visit: Payer: 59 | Admitting: Orthopedic Surgery

## 2020-02-07 NOTE — Telephone Encounter (Signed)
Called patient.  I informed her that Marlou Sa was running late and gave her the option to reschedule. She chose to return on the 14th but is requesting a note extending her work restrictions.   Case manager also needs a copy faxed.   Case manager fax number: [phone] Patient call back: [phone]

## 2020-02-07 NOTE — Telephone Encounter (Signed)
Please advise about note. Thanks.

## 2020-02-07 NOTE — Telephone Encounter (Signed)
Okay for note please call thanks

## 2020-02-08 NOTE — Telephone Encounter (Signed)
Can you fax the note for me please, its in her chart

## 2020-02-08 NOTE — Telephone Encounter (Signed)
Faxed

## 2020-02-16 ENCOUNTER — Other Ambulatory Visit: Payer: Self-pay

## 2020-02-16 ENCOUNTER — Ambulatory Visit (INDEPENDENT_AMBULATORY_CARE_PROVIDER_SITE_OTHER): Payer: Worker's Compensation | Admitting: Orthopedic Surgery

## 2020-02-16 DIAGNOSIS — S82035D Nondisplaced transverse fracture of left patella, subsequent encounter for closed fracture with routine healing: Secondary | ICD-10-CM | POA: Diagnosis not present

## 2020-02-18 ENCOUNTER — Encounter: Payer: Self-pay | Admitting: Orthopedic Surgery

## 2020-02-18 NOTE — Progress Notes (Signed)
Office Visit Note   Patient: Kristy Yang           Date of Birth: 1951-10-16           MRN: [id number] Visit Date: 02/16/2020 Requested by: Lauraine Rinne, MD [id number] [place],  [place] [address] PCP: Lauraine Rinne, MD  Subjective: Chief Complaint  Patient presents with  . Left Knee - Follow-up    HPI: Patient presents now about 4 months out left patella fracture open reduction internal fixation.  Still has some soreness with ambulation.  Fracture was healed in February.  She has been in therapy but her sessions have expired.  She uses a cane at times.  She has been working half days since 319.              ROS: All systems reviewed are negative as they relate to the chief complaint within the history of present illness.  Patient denies  fevers or chills.   Assessment & Plan: Visit Diagnoses:  1. Closed nondisplaced transverse fracture of left patella with routine healing, subsequent encounter     Plan: Impression is mild left leg weakness with some mild crepitus with range of motion in that left patella.  Fairly symmetric with the right-hand side.  No effusion today.  Extensor mechanism is intact.  I am going to have her work half days from 319 until 5 7 and then she is okay to do regular duty.  Come back to see me in 8 weeks for final check rating and release.  Follow-Up Instructions: No follow-ups on file.   Orders:  No orders of the defined types were placed in this encounter.  No orders of the defined types were placed in this encounter.     Procedures: No procedures performed   Clinical Data: No additional findings.  Objective: Vital Signs: There were no vitals taken for this visit.  Physical Exam:   Constitutional: Patient appears well-developed HEENT:  Head: Normocephalic Eyes:EOM are normal Neck: Normal range of motion Cardiovascular: Normal rate Pulmonary/chest: Effort normal Neurologic: Patient is alert Skin: Skin  is warm Psychiatric: Patient has normal mood and affect    Ortho Exam: Ortho exam demonstrates full active and passive range of motion of both knees.  Mild patellofemoral crepitus is present.  No effusion on the left-hand side.  Extensor mechanism is intact.  Has about a centimeter quad atrophy on the left compared to the right.  Specialty Comments:  No specialty comments available.  Imaging: No results found.   PMFS History: Patient Active Problem List   Diagnosis Date Noted  . HTN (hypertension) 06/16/2012  . Hypothyroid 06/16/2012  . Goiter 06/16/2012   Past Medical History:  Diagnosis Date  . Anxiety   . Cataracts, bilateral   . Depression   . GERD (gastroesophageal reflux disease)   . Grave's disease    on no medications at this time  . Heart murmur    no problems  . Hyperlipidemia   . Hypertension   . Pneumonia    as a baby    Family History  Problem Relation Age of Onset  . Colon cancer Sister   . Colon cancer Maternal Aunt   . Diabetes Mother   . Diabetes Father   . Stroke Maternal Grandmother   . Rectal cancer Neg Hx   . Stomach cancer Neg Hx   . Esophageal cancer Neg Hx     Past Surgical History:  Procedure Laterality  Date  . CATARACT EXTRACTION  2010   bilateral  . COLONOSCOPY  2006  . ORIF PATELLA Left 10/14/2019   Procedure: OPEN REDUCTION INTERNAL (ORIF) FIXATION LEFT PATELLA;  Surgeon: Meredith Pel, MD;  Location: [facility];  Service: Orthopedics;  Laterality: Left;  . PARTIAL HYSTERECTOMY    . POLYPECTOMY     Social History   Occupational History  . Not on file  Tobacco Use  . Smoking status: Former Smoker    Quit date: 07/26/1992    Years since quitting: 27.5  . Smokeless tobacco: Never Used  Substance and Sexual Activity  . Alcohol use: No  . Drug use: No  . Sexual activity: Not on file

## 2020-03-15 ENCOUNTER — Ambulatory Visit: Payer: 59 | Admitting: Orthopedic Surgery

## 2020-03-29 ENCOUNTER — Ambulatory Visit (INDEPENDENT_AMBULATORY_CARE_PROVIDER_SITE_OTHER): Payer: Worker's Compensation | Admitting: Orthopedic Surgery

## 2020-03-29 ENCOUNTER — Other Ambulatory Visit: Payer: Self-pay

## 2020-03-29 ENCOUNTER — Encounter: Payer: Self-pay | Admitting: Orthopedic Surgery

## 2020-03-29 DIAGNOSIS — S82035D Nondisplaced transverse fracture of left patella, subsequent encounter for closed fracture with routine healing: Secondary | ICD-10-CM

## 2020-03-29 NOTE — Progress Notes (Signed)
Office Visit Note   Patient: Kristy Yang           Date of Birth: January 22, 1951           MRN: [id number] Visit Date: 03/29/2020 Requested by: Lauraine Rinne, MD [id number] [place],  [place] [address] PCP: Lauraine Rinne, MD  Subjective: Chief Complaint  Patient presents with  . Left Knee - Pain, Follow-up    HPI: Kristy Yang is a 69 year old patient is now about 5 and half months out left knee patella fracture fixation. She has been 2 weeks and work conditioning. She is back to work full-time. Overall doing well. Has some occasional episodes of giving way and she does use a cane. The cane is part-time use only.              ROS: All systems reviewed are negative as they relate to the chief complaint within the history of present illness.  Patient denies  fevers or chills.   Assessment & Plan: Visit Diagnoses:  1. Closed nondisplaced transverse fracture of left patella with routine healing, subsequent encounter     Plan: Impression is left knee patellofemoral mild crepitus following open reduction internal fixation of nondisplaced patella fracture. [place] is occurred. Hardware not prominent. She has mild symptoms subjectively and objectively on exam. No knee effusion. She is rated at 7% of the left knee based on page 510 on the [facility] guides to the evaluation of permanent impairment. She will follow-up with me as needed.  Follow-Up Instructions: Return if symptoms worsen or fail to improve.   Orders:  No orders of the defined types were placed in this encounter.  No orders of the defined types were placed in this encounter.     Procedures: No procedures performed   Clinical Data: No additional findings.  Objective: Vital Signs: There were no vitals taken for this visit.  Physical Exam:   Constitutional: Patient appears well-developed HEENT:  Head: Normocephalic Eyes:EOM are normal Neck: Normal range of motion Cardiovascular: Normal  rate Pulmonary/chest: Effort normal Neurologic: Patient is alert Skin: Skin is warm Psychiatric: Patient has normal mood and affect    Ortho Exam: Ortho exam demonstrates more patellofemoral crepitus on the left compared to the right. Extensor mechanism is intact. Mild quad atrophy present less than 1 cm left versus right. Range of motion is good on the left and right-hand side. Collateral and cruciate ligaments are stable. No knee effusion.  Specialty Comments:  No specialty comments available.  Imaging: No results found.   PMFS History: Patient Active Problem List   Diagnosis Date Noted  . HTN (hypertension) 06/16/2012  . Hypothyroid 06/16/2012  . Goiter 06/16/2012   Past Medical History:  Diagnosis Date  . Anxiety   . Cataracts, bilateral   . Depression   . GERD (gastroesophageal reflux disease)   . Grave's disease    on no medications at this time  . Heart murmur    no problems  . Hyperlipidemia   . Hypertension   . Pneumonia    as a baby    Family History  Problem Relation Age of Onset  . Colon cancer Sister   . Colon cancer Maternal Aunt   . Diabetes Mother   . Diabetes Father   . Stroke Maternal Grandmother   . Rectal cancer Neg Hx   . Stomach cancer Neg Hx   . Esophageal cancer Neg Hx     Past Surgical History:  Procedure Laterality Date  .  CATARACT EXTRACTION  2010   bilateral  . COLONOSCOPY  2006  . ORIF PATELLA Left 10/14/2019   Procedure: OPEN REDUCTION INTERNAL (ORIF) FIXATION LEFT PATELLA;  Surgeon: Meredith Pel, MD;  Location: [facility];  Service: Orthopedics;  Laterality: Left;  . PARTIAL HYSTERECTOMY    . POLYPECTOMY     Social History   Occupational History  . Not on file  Tobacco Use  . Smoking status: Former Smoker    Quit date: 07/26/1992    Years since quitting: 27.6  . Smokeless tobacco: Never Used  Substance and Sexual Activity  . Alcohol use: No  . Drug use: No  . Sexual activity: Not on file

## 2020-04-26 ENCOUNTER — Ambulatory Visit: Payer: 59 | Admitting: Orthopedic Surgery

## 2021-11-18 ENCOUNTER — Emergency Department (HOSPITAL_COMMUNITY): Payer: Medicare Other

## 2021-11-18 ENCOUNTER — Encounter (HOSPITAL_COMMUNITY): Payer: Self-pay | Admitting: Emergency Medicine

## 2021-11-18 ENCOUNTER — Emergency Department (HOSPITAL_COMMUNITY)
Admission: EM | Admit: 2021-11-18 | Discharge: 2021-11-18 | Disposition: A | Discharge status: Home / Self Care | Payer: Medicare Other | Attending: Emergency Medicine | Admitting: Emergency Medicine

## 2021-11-18 ENCOUNTER — Other Ambulatory Visit: Payer: Self-pay

## 2021-11-18 DIAGNOSIS — K219 Gastro-esophageal reflux disease without esophagitis: Secondary | ICD-10-CM | POA: Diagnosis not present

## 2021-11-18 DIAGNOSIS — Z79899 Other long term (current) drug therapy: Secondary | ICD-10-CM | POA: Diagnosis not present

## 2021-11-18 DIAGNOSIS — R1084 Generalized abdominal pain: Secondary | ICD-10-CM | POA: Diagnosis not present

## 2021-11-18 DIAGNOSIS — I10 Essential (primary) hypertension: Secondary | ICD-10-CM | POA: Insufficient documentation

## 2021-11-18 DIAGNOSIS — R112 Nausea with vomiting, unspecified: Secondary | ICD-10-CM | POA: Diagnosis not present

## 2021-11-18 DIAGNOSIS — R197 Diarrhea, unspecified: Secondary | ICD-10-CM | POA: Diagnosis not present

## 2021-11-18 DIAGNOSIS — K625 Hemorrhage of anus and rectum: Secondary | ICD-10-CM | POA: Insufficient documentation

## 2021-11-18 LAB — COMPREHENSIVE METABOLIC PANEL
ALT: 21 U/L (ref 0–44)
AST: 42 U/L — ABNORMAL HIGH (ref 15–41)
Albumin: 4.8 g/dL (ref 3.5–5.0)
Alkaline Phosphatase: 65 U/L (ref 38–126)
Anion gap: 5 (ref 5–15)
BUN: 14 mg/dL (ref 8–23)
CO2: 28 mmol/L (ref 22–32)
Calcium: 9.3 mg/dL (ref 8.9–10.3)
Chloride: 104 mmol/L (ref 98–111)
Creatinine, Ser: 0.49 mg/dL (ref 0.44–1.00)
GFR, Estimated: >60 mL/min (ref >60)
Glucose, Bld: 117 mg/dL — ABNORMAL HIGH (ref 70–99)
Potassium: 4.4 mmol/L (ref 3.5–5.1)
Sodium: 137 mmol/L (ref 135–145)
Total Bilirubin: 1.6 mg/dL — ABNORMAL HIGH (ref 0.3–1.2)
Total Protein: 9 g/dL — ABNORMAL HIGH (ref 6.5–8.1)

## 2021-11-18 LAB — CBC WITH DIFFERENTIAL/PLATELET
Abs Immature Granulocytes: 0.02 10*3/uL (ref 0.00–0.07)
Basophils Absolute: 0 10*3/uL (ref 0.0–0.1)
Basophils Relative: 1 %
Eosinophils Absolute: 0 10*3/uL (ref 0.0–0.5)
Eosinophils Relative: 0 %
HCT: 41.2 % (ref 36.0–46.0)
Hemoglobin: 13.8 g/dL (ref 12.0–15.0)
Immature Granulocytes: 0 %
Lymphocytes Relative: 24 %
Lymphs Abs: 1.6 10*3/uL (ref 0.7–4.0)
MCH: 30.3 pg (ref 26.0–34.0)
MCHC: 33.5 g/dL (ref 30.0–36.0)
MCV: 90.4 fL (ref 80.0–100.0)
Monocytes Absolute: 0.3 10*3/uL (ref 0.1–1.0)
Monocytes Relative: 5 %
Neutro Abs: 4.9 10*3/uL (ref 1.7–7.7)
Neutrophils Relative %: 70 %
Platelets: 278 10*3/uL (ref 150–400)
RBC: 4.56 MIL/uL (ref 3.87–5.11)
RDW: 13 % (ref 11.5–15.5)
WBC: 6.9 10*3/uL (ref 4.0–10.5)
nRBC: 0 % (ref 0.0–0.2)

## 2021-11-18 LAB — URINALYSIS, ROUTINE W REFLEX MICROSCOPIC
Bacteria, UA: NONE SEEN
Bilirubin Urine: NEGATIVE
Glucose, UA: NEGATIVE mg/dL
Ketones, ur: NEGATIVE mg/dL
Leukocytes,Ua: NEGATIVE
Nitrite: NEGATIVE
Protein, ur: NEGATIVE mg/dL
Specific Gravity, Urine: 1.011 (ref 1.005–1.030)
pH: 7 (ref 5.0–8.0)

## 2021-11-18 LAB — LIPASE, BLOOD: Lipase: 24 U/L (ref 11–51)

## 2021-11-18 LAB — POC OCCULT BLOOD, ED: Fecal Occult Bld: POSITIVE — AB

## 2021-11-18 MED ORDER — IOHEXOL 350 MG/ML SOLN
100.0000 mL | Freq: Once | INTRAVENOUS | Status: AC | PRN
  Administered 2021-11-18: 100 mL via INTRAVENOUS

## 2021-11-18 MED ORDER — ONDANSETRON HCL 4 MG/2ML IJ SOLN
4.0000 mg | Freq: Once | INTRAMUSCULAR | Status: AC
  Administered 2021-11-18: 4 mg via INTRAVENOUS
  Filled 2021-11-18: qty 2

## 2021-11-18 MED ORDER — CIPROFLOXACIN HCL 500 MG PO TABS: 500.0000 mg | ORAL_TABLET | Freq: Two times a day (BID) | ORAL | 0 refills | Status: DC

## 2021-11-18 MED ORDER — METRONIDAZOLE 500 MG PO TABS
500.0000 mg | ORAL_TABLET | Freq: Once | ORAL | Status: AC
  Administered 2021-11-18: 500 mg via ORAL
  Filled 2021-11-18: qty 1

## 2021-11-18 MED ORDER — METRONIDAZOLE 500 MG PO TABS: 500.0000 mg | ORAL_TABLET | Freq: Two times a day (BID) | ORAL | 0 refills | Status: DC

## 2021-11-18 MED ORDER — ONDANSETRON 4 MG PO TBDP: 4.0000 mg | ORAL_TABLET | Freq: Three times a day (TID) | ORAL | 0 refills | Status: DC | PRN

## 2021-11-18 MED ORDER — SODIUM CHLORIDE 0.9 % IV BOLUS
1000.0000 mL | Freq: Once | INTRAVENOUS | Status: AC
  Administered 2021-11-18: 1000 mL via INTRAVENOUS

## 2021-11-18 MED ORDER — CIPROFLOXACIN HCL 500 MG PO TABS
500.0000 mg | ORAL_TABLET | Freq: Once | ORAL | Status: AC
Start: 2021-11-18 — End: 2021-11-18
  Administered 2021-11-18: 500 mg via ORAL
  Filled 2021-11-18: qty 1

## 2021-11-18 MED ORDER — MORPHINE SULFATE (PF) 4 MG/ML IV SOLN
4.0000 mg | Freq: Once | INTRAVENOUS | Status: AC
  Administered 2021-11-18: 4 mg via INTRAVENOUS
  Filled 2021-11-18: qty 1

## 2021-11-18 MED ORDER — HYDROCODONE-ACETAMINOPHEN 5-325 MG PO TABS
1.0000 | ORAL_TABLET | ORAL | 0 refills | Status: DC | PRN
Start: 2021-11-18 — End: 2023-11-25

## 2021-11-18 NOTE — ED Notes (Signed)
Pt ambulated to RR and back w/o assistance, steady gait noted. Urine sample obtained and sent to lab.

## 2021-11-18 NOTE — ED Provider Notes (Signed)
[facility] DEPT Provider Note   CSN: [id number] Arrival date & time: 11/18/21  3329     History  Chief Complaint  Patient presents with   Abdominal Pain   Rectal Bleeding    Kristy Yang is a 71 y.o. female.  Pt is a 71 yo bf with a hx of htn, high cholesterol, gerd, anxiety, depression, and Graves disease.  Pt comes in today with abdominal pain and cramping that started last night.  She had n/v/d all night.  This morning, she felt like she would have to have a bowel movement and now it is just blood.  She has not had any recent travel or abx.  She is not on blood thinners.  No fevers.      Home Medications Prior to Admission medications   Medication Sig Start Date End Date Taking? Authorizing Provider  ciprofloxacin (CIPRO) 500 MG tablet Take 1 tablet (500 mg total) by mouth every 12 (twelve) hours. 11/18/21  Yes Isla Pence, MD  HYDROcodone-acetaminophen (NORCO/VICODIN) 5-325 MG tablet Take 1 tablet by mouth every 4 (four) hours as needed. 11/18/21  Yes Isla Pence, MD  metroNIDAZOLE (FLAGYL) 500 MG tablet Take 1 tablet (500 mg total) by mouth 2 (two) times daily. 11/18/21  Yes Isla Pence, MD  ondansetron (ZOFRAN-ODT) 4 MG disintegrating tablet Take 1 tablet (4 mg total) by mouth every 8 (eight) hours as needed for nausea or vomiting. 11/18/21  Yes Isla Pence, MD  amLODipine (NORVASC) 10 MG tablet Take 1 tablet (10 mg total) by mouth daily. 02/17/13   Theda Sers, PA-C  atorvastatin (LIPITOR) 20 MG tablet Take 20 mg by mouth daily.    [provider]  chlorthalidone (HYGROTON) 50 MG tablet Take 50 mg by mouth daily.    [provider]  DULoxetine (CYMBALTA) 30 MG capsule Take 30 mg by mouth 2 (two) times daily. 01/25/19   [provider]  ibuprofen (ADVIL) 200 MG tablet Take 400 mg by mouth every 6 (six) hours as needed for headache or moderate pain.    [provider]  methocarbamol  (ROBAXIN) 500 MG tablet Take 1 tablet (500 mg total) by mouth every 8 (eight) hours as needed. 10/22/19   Meredith Pel, MD  omeprazole (PRILOSEC) 20 MG capsule Take 20 mg by mouth daily as needed (heart burn).  05/26/19   [provider]  oxyCODONE (ROXICODONE) 5 MG immediate release tablet Take 1 tablet (5 mg total) by mouth every 12 (twelve) hours as needed for severe pain. 1 po q 8 hrs prn pain 11/17/19   Magnant, Gerrianne Scale, PA-C      Allergies    Patient has no known allergies.    Review of Systems   Review of Systems  Gastrointestinal:  Positive for abdominal pain, blood in stool, diarrhea, nausea and vomiting.  All other systems reviewed and are negative.  Physical Exam Updated Vital Signs BP (!) 153/93 (BP Location: Right Arm)    Pulse 74    Temp (!) 97.5 F (36.4 C) (Oral)    Resp 17    Ht 5\' 2"  (1.575 m)    Wt 78 kg    SpO2 96%    BMI 31.45 kg/m  Physical Exam Vitals and nursing note reviewed.  Constitutional:      Appearance: She is well-developed.  HENT:     Head: Normocephalic and atraumatic.     Mouth/Throat:     Mouth: Mucous membranes are dry.  Eyes:  Extraocular Movements: Extraocular movements intact.     Pupils: Pupils are equal, round, and reactive to light.  Cardiovascular:     Rate and Rhythm: Normal rate and regular rhythm.  Abdominal:     General: Abdomen is flat. Bowel sounds are normal.     Palpations: Abdomen is soft.     Tenderness: There is generalized abdominal tenderness.  Genitourinary:    Rectum: Guaiac result positive.     Comments: Grossly bloody stool Skin:    General: Skin is warm.     Capillary Refill: Capillary refill takes less than 2 seconds.  Neurological:     General: No focal deficit present.     Mental Status: She is alert and oriented to person, place, and time.  Psychiatric:        Mood and Affect: Mood normal.        Behavior: Behavior normal.    ED Results / Procedures / Treatments   Labs (all labs  ordered are listed, but only abnormal results are displayed) Labs Reviewed  COMPREHENSIVE METABOLIC PANEL - Abnormal; Notable for the following components:      Result Value   Glucose, Bld 117 (*)    Total Protein 9.0 (*)    AST 42 (*)    Total Bilirubin 1.6 (*)    All other components within normal limits  URINALYSIS, ROUTINE W REFLEX MICROSCOPIC - Abnormal; Notable for the following components:   Color, Urine STRAW (*)    Hgb urine dipstick SMALL (*)    All other components within normal limits  POC OCCULT BLOOD, ED - Abnormal; Notable for the following components:   Fecal Occult Bld POSITIVE (*)    All other components within normal limits  CBC WITH DIFFERENTIAL/PLATELET  LIPASE, BLOOD    EKG None  Radiology CT Angio Abd/Pel W and/or Wo Contrast  Result Date: 11/18/2021 CLINICAL DATA:  GI bleed, lower EXAM: CTA ABDOMEN AND PELVIS WITHOUT AND WITH CONTRAST TECHNIQUE: Multidetector CT imaging of the abdomen and pelvis was performed using the standard protocol during bolus administration of intravenous contrast. Multiplanar reconstructed images and MIPs were obtained and reviewed to evaluate the vascular anatomy. RADIATION DOSE REDUCTION: This exam was performed according to the departmental dose-optimization program which includes automated exposure control, adjustment of the mA and/or kV according to patient size and/or use of iterative reconstruction technique. CONTRAST:  161mL OMNIPAQUE IOHEXOL 350 MG/ML SOLN COMPARISON:  US Renal, 02/10/2013.  KUB, 02/10/2013. FINDINGS: Suboptimal evaluation, secondary to motion degradation. VASCULAR Aorta: Normal caliber aorta without aneurysm, dissection, vasculitis or significant stenosis. Celiac: Widely patent without evidence of aneurysm, dissection, vasculitis or significant stenosis. SMA: Widely patent without evidence of aneurysm, dissection, vasculitis or significant stenosis. Renals: Single renal arteries present bilaterally. Both renal  arteries are patent without evidence of aneurysm, dissection, vasculitis, fibromuscular dysplasia or significant stenosis. IMA: Widely patent without evidence of aneurysm, dissection, vasculitis or significant stenosis. Pelvis: Widely patent without evidence of aneurysm, dissection, vasculitis or significant stenosis. Proximal Outflow: Bilateral common femoral and visualized portions of the superficial and profunda femoral arteries are patent without evidence of aneurysm, dissection, vasculitis or significant stenosis. Veins: No obvious venous abnormality within the limitations of this arterial phase study. Review of the MIP images confirms the above findings. NON-VASCULAR Lower chest: No acute abnormality. Hepatobiliary: No focal liver abnormality is seen. No gallstones, gallbladder wall thickening, or biliary dilatation. Pancreas: No pancreatic ductal dilatation or surrounding inflammatory changes. Spleen: Normal in size without focal abnormality. Adrenals/Urinary Tract: Adrenal glands  are unremarkable. Large bilateral exophytic and LEFT parapelvic cysts. No radiodense renal calculus or hydronephrosis. Bladder is moderately distended. Stomach/Bowel: Stomach is within normal limits. Appendix is not definitively visualized. Mild prominence of central small bowel loop without discrete dilation. Intraluminal contrast opacification of cecum, likely ingested. Nondilated colon. No evidence of bowel wall thickening, distention, or inflammatory changes. Lymphatic: No enlarged abdominal or pelvic lymph nodes. Reproductive: Status post hysterectomy. No adnexal masses. Other: No abdominal wall hernia or abnormality. No abdominopelvic ascites. Musculoskeletal: No acute osseous findings. IMPRESSION: VASCULAR 1. No evidence of active extravasation to explain patient's reported lower GI bleed. NON-VASCULAR 1. No acute abdominopelvic process. 2.  Additional incidental, chronic and senescent findings as above. Electronically Signed    By: Michaelle Birks M.D.   On: 11/18/2021 09:33    Procedures Procedures    Medications Ordered in ED Medications  ciprofloxacin (CIPRO) tablet 500 mg (has no administration in time range)  metroNIDAZOLE (FLAGYL) tablet 500 mg (has no administration in time range)  sodium chloride 0.9 % bolus 1,000 mL (0 mLs Intravenous Stopped 11/18/21 0914)  morphine 4 MG/ML injection 4 mg (4 mg Intravenous Given 11/18/21 0756)  ondansetron (ZOFRAN) injection 4 mg (4 mg Intravenous Given 11/18/21 0755)  iohexol (OMNIPAQUE) 350 MG/ML injection 100 mL (100 mLs Intravenous Contrast Given 11/18/21 0851)    ED Course/ Medical Decision Making/ A&P                           Medical Decision Making  Pt's labs and ct scans are reviewed.  CBC is nl and CMP is nl.  CTA abd/pelvis did not show anything acute.   She has not had any further rectal bleeding while here.  She is not on blood thinners.    Due to bloody diarrhea, I am going to send pt home with cipro/flagyl.  She has not been able to give [place] a stool specimen here.  She feels much better after treatment.    She has seen [facility] in the past.  She is due for another colonoscopy in a few months.  She has a hx of benign polyps that have been removed in the past.  Pt is instructed to f/u with GI.  Plan d/w pt and her husband.  Pt is stable for d/c.  Return if worse.        Final Clinical Impression(s) / ED Diagnoses Final diagnoses:  Rectal bleeding  Generalized abdominal pain  Diarrhea, unspecified type    Rx / DC Orders ED Discharge Orders          Ordered    ciprofloxacin (CIPRO) 500 MG tablet  Every 12 hours        11/18/21 1048    metroNIDAZOLE (FLAGYL) 500 MG tablet  2 times daily        11/18/21 1048    HYDROcodone-acetaminophen (NORCO/VICODIN) 5-325 MG tablet  Every 4 hours PRN        11/18/21 1048    ondansetron (ZOFRAN-ODT) 4 MG disintegrating tablet  Every 8 hours PRN        11/18/21 1048              Isla Pence, MD 11/18/21 1053

## 2021-11-18 NOTE — ED Triage Notes (Signed)
Patient c/o abdominal pain with N/V/D since last night. States this morning blood in stool. No blood thinners.

## 2022-05-02 ENCOUNTER — Encounter: Payer: Self-pay | Admitting: Gastroenterology

## 2023-04-15 ENCOUNTER — Emergency Department (HOSPITAL_BASED_OUTPATIENT_CLINIC_OR_DEPARTMENT_OTHER): Payer: Medicare Other

## 2023-04-15 ENCOUNTER — Other Ambulatory Visit: Payer: Self-pay

## 2023-04-15 ENCOUNTER — Encounter (HOSPITAL_BASED_OUTPATIENT_CLINIC_OR_DEPARTMENT_OTHER): Payer: Self-pay | Admitting: Urology

## 2023-04-15 ENCOUNTER — Emergency Department (HOSPITAL_BASED_OUTPATIENT_CLINIC_OR_DEPARTMENT_OTHER)
Admission: EM | Admit: 2023-04-15 | Discharge: 2023-04-15 | Disposition: A | Discharge status: Home / Self Care | Payer: Medicare Other | Attending: Emergency Medicine | Admitting: Emergency Medicine

## 2023-04-15 DIAGNOSIS — E876 Hypokalemia: Secondary | ICD-10-CM | POA: Insufficient documentation

## 2023-04-15 DIAGNOSIS — R0789 Other chest pain: Secondary | ICD-10-CM

## 2023-04-15 DIAGNOSIS — Z79899 Other long term (current) drug therapy: Secondary | ICD-10-CM | POA: Diagnosis not present

## 2023-04-15 DIAGNOSIS — I10 Essential (primary) hypertension: Secondary | ICD-10-CM | POA: Insufficient documentation

## 2023-04-15 DIAGNOSIS — R079 Chest pain, unspecified: Secondary | ICD-10-CM | POA: Insufficient documentation

## 2023-04-15 LAB — BASIC METABOLIC PANEL
Anion gap: 10 (ref 5–15)
BUN: 15 mg/dL (ref 8–23)
CO2: 28 mmol/L (ref 22–32)
Calcium: 9.2 mg/dL (ref 8.9–10.3)
Chloride: 99 mmol/L (ref 98–111)
Creatinine, Ser: 0.8 mg/dL (ref 0.44–1.00)
GFR, Estimated: >60 mL/min (ref >60)
Glucose, Bld: 97 mg/dL (ref 70–99)
Potassium: 2.8 mmol/L — ABNORMAL LOW (ref 3.5–5.1)
Sodium: 137 mmol/L (ref 135–145)

## 2023-04-15 LAB — CBC
HCT: 37.1 % (ref 36.0–46.0)
Hemoglobin: 12.6 g/dL (ref 12.0–15.0)
MCH: 30.7 pg (ref 26.0–34.0)
MCHC: 34 g/dL (ref 30.0–36.0)
MCV: 90.3 fL (ref 80.0–100.0)
Platelets: 232 10*3/uL (ref 150–400)
RBC: 4.11 MIL/uL (ref 3.87–5.11)
RDW: 12.7 % (ref 11.5–15.5)
WBC: 4.2 10*3/uL (ref 4.0–10.5)
nRBC: 0 % (ref 0.0–0.2)

## 2023-04-15 LAB — TROPONIN I (HIGH SENSITIVITY)
Troponin I (High Sensitivity): 8 ng/L (ref <18)
Troponin I (High Sensitivity): 11 ng/L (ref <18)

## 2023-04-15 MED ORDER — ALUM & MAG HYDROXIDE-SIMETH 200-200-20 MG/5ML PO SUSP
30.0000 mL | Freq: Once | ORAL | Status: AC
  Administered 2023-04-15: 30 mL via ORAL
  Filled 2023-04-15: qty 30

## 2023-04-15 MED ORDER — LIDOCAINE VISCOUS HCL 2 % MT SOLN
15.0000 mL | Freq: Once | OROMUCOSAL | Status: AC
  Administered 2023-04-15: 15 mL via ORAL
  Filled 2023-04-15: qty 15

## 2023-04-15 MED ORDER — POTASSIUM CHLORIDE CRYS ER 20 MEQ PO TBCR
40.0000 meq | EXTENDED_RELEASE_TABLET | Freq: Once | ORAL | Status: AC
  Administered 2023-04-15: 40 meq via ORAL
  Filled 2023-04-15: qty 2

## 2023-04-15 MED ORDER — POTASSIUM CHLORIDE CRYS ER 20 MEQ PO TBCR: 40.0000 meq | EXTENDED_RELEASE_TABLET | Freq: Every day | ORAL | 0 refills | Status: DC

## 2023-04-15 NOTE — ED Notes (Signed)
Patient transported to X-ray 

## 2023-04-15 NOTE — Discharge Instructions (Signed)
You have been seen today for your complaint of chest pain. Your lab work showed low potassium but was otherwise reassuring. Your imaging was reassuring and showed no abnormalities. Your discharge medications include potassium.  Take it as prescribed and for the entire duration of the prescription. Follow up with: Cardiology.  They should call you within the next 3 business days.  If you do not hear back from them you may call the phone number listed in this packet. Please seek immediate medical care if you develop any of the following symptoms: Your chest pain gets worse. You have a cough that gets worse, or you cough up blood. You have severe pain in your abdomen. You faint. You have sudden, unexplained chest discomfort. You have sudden, unexplained discomfort in your arms, back, neck, or jaw. You have shortness of breath at any time. You suddenly start to sweat, or your skin gets clammy. You feel nausea or you vomit. You suddenly feel lightheaded or dizzy. You have severe weakness, or unexplained weakness or fatigue. Your heart begins to beat quickly, or it feels like it is skipping beats. At this time there does not appear to be the presence of an emergent medical condition, however there is always the potential for conditions to change. Please read and follow the below instructions.  Do not take your medicine if  develop an itchy rash, swelling in your mouth or lips, or difficulty breathing; call 911 and seek immediate emergency medical attention if this occurs.  You may review your lab tests and imaging results in their entirety on your MyChart account.  Please discuss all results of fully with your primary care provider and other specialist at your follow-up visit.  Note: Portions of this text may have been transcribed using voice recognition software. Every effort was made to ensure accuracy; however, inadvertent computerized transcription errors may still be present.

## 2023-04-15 NOTE — ED Provider Notes (Signed)
EMERGENCY DEPARTMENT AT [facility] Provider Note   CSN: [id number] Arrival date & time: 04/15/23  1357     History  Chief Complaint  Patient presents with   Chest Pain    Kristy Yang is a 72 y.o. female.  With history of hypertension, hyperlipidemia, anxiety, depression who presents to the ED for evaluation of chest pain.  She states this symptom began 6 days ago and has progressively gotten worse.  Describes it as a pressure.  Localizes it to the substernal area with occasional radiation to the right arm.  She states it is mostly random but occasionally occurs with exertion.  She also reports associated exertional shortness of breath.  She denies cardiac history outside of hypertension and hyperlipidemia.  She denies associated nausea or vomiting.  No hemoptysis, unilateral leg swelling, history of DVT or PE, estrogen use, recent cancer treatments, surgeries, long distance travel.  She is currently asymptomatic.   Chest Pain      Home Medications Prior to Admission medications   Medication Sig Start Date End Date Taking? Authorizing Provider  potassium chloride SA (KLOR-CON M) 20 MEQ tablet Take 2 tablets (40 mEq total) by mouth daily for 5 days. 04/15/23 04/20/23 Yes Lazarus Sudbury, Edsel Petrin, PA-C  amLODipine (NORVASC) 10 MG tablet Take 1 tablet (10 mg total) by mouth daily. 02/17/13   Godfrey Pick, PA-C  atorvastatin (LIPITOR) 20 MG tablet Take 20 mg by mouth daily.    [provider]  chlorthalidone (HYGROTON) 50 MG tablet Take 50 mg by mouth daily.    [provider]  ciprofloxacin (CIPRO) 500 MG tablet Take 1 tablet (500 mg total) by mouth every 12 (twelve) hours. 11/18/21   Jacalyn Lefevre, MD  DULoxetine (CYMBALTA) 30 MG capsule Take 30 mg by mouth 2 (two) times daily. 01/25/19   [provider]  HYDROcodone-acetaminophen (NORCO/VICODIN) 5-325 MG tablet Take 1 tablet by mouth every 4 (four) hours as needed. 11/18/21   Jacalyn Lefevre, MD  ibuprofen (ADVIL) 200 MG tablet Take 400 mg by mouth every 6 (six) hours as needed for headache or moderate pain.    [provider]  methocarbamol (ROBAXIN) 500 MG tablet Take 1 tablet (500 mg total) by mouth every 8 (eight) hours as needed. 10/22/19   Cammy Copa, MD  metroNIDAZOLE (FLAGYL) 500 MG tablet Take 1 tablet (500 mg total) by mouth 2 (two) times daily. 11/18/21   Jacalyn Lefevre, MD  omeprazole (PRILOSEC) 20 MG capsule Take 20 mg by mouth daily as needed (heart burn).  05/26/19   [provider]  ondansetron (ZOFRAN-ODT) 4 MG disintegrating tablet Take 1 tablet (4 mg total) by mouth every 8 (eight) hours as needed for nausea or vomiting. 11/18/21   Jacalyn Lefevre, MD  oxyCODONE (ROXICODONE) 5 MG immediate release tablet Take 1 tablet (5 mg total) by mouth every 12 (twelve) hours as needed for severe pain. 1 po q 8 hrs prn pain 11/17/19   Magnant, Joycie Peek, PA-C      Allergies    Patient has no known allergies.    Review of Systems   Review of Systems  Cardiovascular:  Positive for chest pain.  All other systems reviewed and are negative.   Physical Exam Updated Vital Signs BP (!) 155/80   Pulse 71   Temp 98.6 F (37 C)   Resp 19   Ht 5\' 2"  (1.575 m)   Wt 76.7 kg   SpO2 96%   BMI  30.91 kg/m  Physical Exam Vitals and nursing note reviewed.  Constitutional:      General: She is not in acute distress.    Appearance: She is well-developed.  HENT:     Head: Normocephalic and atraumatic.  Eyes:     Conjunctiva/sclera: Conjunctivae normal.  Cardiovascular:     Rate and Rhythm: Normal rate and regular rhythm.     Heart sounds: No murmur heard. Pulmonary:     Effort: Pulmonary effort is normal. No respiratory distress.     Breath sounds: Normal breath sounds. No decreased breath sounds, wheezing, rhonchi or rales.  Chest:     Comments: No rashes or tenderness to palpation Abdominal:     Palpations: Abdomen is soft.     Tenderness:  There is no abdominal tenderness.  Musculoskeletal:        General: No swelling.     Cervical back: Neck supple.     Right lower leg: No edema.     Left lower leg: No edema.  Skin:    General: Skin is warm and dry.     Capillary Refill: Capillary refill takes less than 2 seconds.  Neurological:     General: No focal deficit present.     Mental Status: She is alert and oriented to person, place, and time.  Psychiatric:        Mood and Affect: Mood normal.        Behavior: Behavior normal.     ED Results / Procedures / Treatments   Labs (all labs ordered are listed, but only abnormal results are displayed) Labs Reviewed  BASIC METABOLIC PANEL - Abnormal; Notable for the following components:      Result Value   Potassium 2.8 (*)    All other components within normal limits  CBC  TROPONIN I (HIGH SENSITIVITY)  TROPONIN I (HIGH SENSITIVITY)    EKG EKG Interpretation  Date/Time:  Tuesday April 15 2023 14:06:45 EDT Ventricular Rate:  81 PR Interval:  183 QRS Duration: 96 QT Interval:  506 QTC Calculation: 588 R Axis:   29 Text Interpretation: Sinus rhythm Nonspecific T abnormalities, lateral leads Prolonged QT interval Confirmed by Alona Bene [phone]) on 04/15/2023 2:08:33 PM  Radiology DG Chest 2 View  Result Date: 04/15/2023 CLINICAL DATA:  72 year old female with history of chest pain. EXAM: CHEST - 2 VIEW COMPARISON:  Chest x-ray 06/15/2012. FINDINGS: Lung volumes are normal. No consolidative airspace disease. No pleural effusions. No pneumothorax. No pulmonary nodule or mass noted. Pulmonary vasculature and the cardiomediastinal silhouette are within normal limits. Atherosclerosis in the thoracic aorta. IMPRESSION: 1.  No radiographic evidence of acute cardiopulmonary disease. 2. Aortic atherosclerosis. Electronically Signed   By: Trudie Reed M.D.   On: 04/15/2023 15:28    Procedures Procedures    Medications Ordered in ED Medications  alum & mag  hydroxide-simeth (MAALOX/MYLANTA) 200-200-20 MG/5ML suspension 30 mL (30 mLs Oral Given 04/15/23 1502)    And  lidocaine (XYLOCAINE) 2 % viscous mouth solution 15 mL (15 mLs Oral Given 04/15/23 1502)  potassium chloride SA (KLOR-CON M) CR tablet 40 mEq (40 mEq Oral Given 04/15/23 1500)    ED Course/ Medical Decision Making/ A&P             HEART Score: 4                Medical Decision Making Amount and/or Complexity of Data Reviewed Labs: ordered. Radiology: ordered.  Risk OTC drugs. Prescription drug management.  This patient presents  to the ED for concern of chest pain., this involves an extensive number of treatment options, and is a complaint that carries with it a high risk of complications and morbidity.  The emergent differential diagnosis of chest pain includes: Acute coronary syndrome, pericarditis, aortic dissection, pulmonary embolism, tension pneumothorax, and esophageal rupture.  other urgent/non-acute considerations include, but are not limited to: chronic angina, aortic stenosis, cardiomyopathy, myocarditis, mitral valve prolapse, pulmonary hypertension, hypertrophic obstructive cardiomyopathy (HOCM), aortic insufficiency, right ventricular hypertrophy, pneumonia, pleuritis, bronchitis, pneumothorax, tumor, gastroesophageal reflux disease (GERD), esophageal spasm, Mallory-Weiss syndrome, peptic ulcer disease, biliary disease, pancreatitis, functional gastrointestinal pain, cervical or thoracic disk disease or arthritis, shoulder arthritis, costochondritis, subacromial bursitis, anxiety or panic attack, herpes zoster, breast disorders, chest wall tumors, thoracic outlet syndrome, mediastinitis.  Co morbidities that complicate the patient evaluation  hypertension, hyperlipidemia, anxiety, depression  My initial workup includes ACS rule out, GI cocktail  Additional history obtained from: Nursing notes from this visit. Husband is at bedside and provides a portion of the  history  I ordered, reviewed and interpreted labs which include: CBC, BMP, troponin, delta troponin.  potassium 2.8  I ordered imaging studies including chest x-ray I independently visualized and interpreted imaging which showed normal I agree with the radiologist interpretation  Cardiac Monitoring:  The patient was maintained on a cardiac monitor.  I personally viewed and interpreted the cardiac monitored which showed an underlying rhythm of: NSR  Afebrile, hypertensive but otherwise hemodynamically stable.  72 year old female presenting to the ED for evaluation of chest pain.  States this is marginally exertional.  Symptoms have been present for the past 6 days.  She appears very well on physical exam.  She is in no acute distress.  She remained asymptomatic in the ED.  Lab workup reveals hypokalemia which was treated in the ED and with a prescription for potassium.  Ambulatory referral to cardiology was ordered.  Low suspicion for ACS as the cause of her symptoms today.  Wells score 0 and I have low suspicion for PE as the cause of her symptoms.  She was encouraged to follow-up with her PCP within the next week for reevaluation of her potassium.  She was given strict return precautions.  Stable at discharge.  At this time there does not appear to be any evidence of an acute emergency medical condition and the patient appears stable for discharge with appropriate outpatient follow up. Diagnosis was discussed with patient who verbalizes understanding of care plan and is agreeable to discharge. I have discussed return precautions with patient and husband who verbalizes understanding. Patient encouraged to follow-up with their PCP within 5 days. All questions answered.  Patient's case discussed with Dr. Rubin Payor who agrees with plan to discharge with follow-up.   Note: Portions of this report may have been transcribed using voice recognition software. Every effort was made to ensure accuracy;  however, inadvertent computerized transcription errors may still be present.        Final Clinical Impression(s) / ED Diagnoses Final diagnoses:  Atypical chest pain  Hypokalemia    Rx / DC Orders ED Discharge Orders          Ordered    potassium chloride SA (KLOR-CON M) 20 MEQ tablet  Daily        04/15/23 1719    Ambulatory referral to Cardiology       Comments: If you have not heard from the Cardiology office within the next 72 hours please call [phone].   04/15/23 1719  Mora Bellman 04/15/23 Worthy Rancher, MD 04/15/23 [phone]

## 2023-04-15 NOTE — ED Triage Notes (Signed)
Pt states mid chest pain that started 6 days ago and is intermittent, states pain radiates to her back and down her left arm at times  States SOB with exertion   No cardiac history

## 2023-05-29 ENCOUNTER — Encounter: Payer: Self-pay | Admitting: Internal Medicine

## 2023-05-29 ENCOUNTER — Ambulatory Visit: Payer: PRIVATE HEALTH INSURANCE | Attending: Internal Medicine | Admitting: Internal Medicine

## 2023-05-29 ENCOUNTER — Other Ambulatory Visit: Payer: Self-pay

## 2023-05-29 VITALS — BP 144/82 | HR 84 | Ht 61.5 in | Wt 173.8 lb

## 2023-05-29 DIAGNOSIS — R079 Chest pain, unspecified: Secondary | ICD-10-CM

## 2023-05-29 DIAGNOSIS — R072 Precordial pain: Secondary | ICD-10-CM

## 2023-05-29 MED ORDER — CARVEDILOL 3.125 MG PO TABS: 3.1250 mg | ORAL_TABLET | Freq: Two times a day (BID) | ORAL | 3 refills | Status: DC

## 2023-05-29 MED ORDER — METOPROLOL TARTRATE 100 MG PO TABS: 100.0000 mg | ORAL_TABLET | Freq: Once | ORAL | 0 refills | Status: DC

## 2023-05-29 NOTE — Patient Instructions (Addendum)
Medication Instructions:  Your physician has recommended you make the following change in your medication:   Start carvedilol 3.125 mg 2 times daily.  *If you need a refill on your cardiac medications before your next appointment, please call your pharmacy*   Lab Work: BMET, [facility], Lipids If you have labs (blood work) drawn today and your tests are completely normal, you will receive your results only by: MyChart Message (if you have MyChart) OR A paper copy in the mail If you have any lab test that is abnormal or we need to change your treatment, we will call you to review the results.   Testing/Procedures: Your physician has requested that you have cardiac CT. Cardiac computed tomography (CT) is a painless test that uses an x-ray machine to take clear, detailed pictures of your heart. For further information please visit [email or web address] Please follow instruction sheet as given.     Follow-Up: At [facility], you and your health needs are our priority.  As part of our continuing mission to provide you with exceptional heart care, we have created designated Provider Care Teams.  These Care Teams include your primary Cardiologist (physician) and Advanced Practice Providers (APPs -  Physician Assistants and Nurse Practitioners) who all work together to provide you with the care you need, when you need it.   Your next appointment:   6 month(s)  Provider:   Dr Wyline Mood   Other Instructions    [facility] [address] [place], [place] [address] [phone]  please arrive at the [facility] and Children's Entrance (Entrance C2) of [facility] 30 minutes prior to test start time. You can use the FREE valet parking offered at entrance C (encouraged to control the heart rate for the test)  Proceed to the [facility] Radiology Department (first floor) to check-in and test prep.  All radiology patients and guests should use entrance C2 at [facility], accessed from [facility], even though the hospital's physical address listed is [address].     Please follow these instructions carefully (unless otherwise directed):   On the Night Before the Test: Be sure to Drink plenty of water. Do not consume any caffeinated/decaffeinated beverages or chocolate 12 hours prior to your test. Do not take any antihistamines 12 hours prior to your test.  On the Day of the Test: Drink plenty of water until 1 hour prior to the test. Do not eat any food 1 hour prior to test. You may take your regular medications prior to the test.  Take metoprolol (Lopressor) two hours prior to test. FEMALES- please wear underwire-free bra if available, avoid dresses & tight clothing      After the Test: Drink plenty of water. After receiving IV contrast, you may experience a mild flushed feeling. This is normal. On occasion, you may experience a mild rash up to 24 hours after the test. This is not dangerous. If this occurs, you can take Benadryl 25 mg and increase your fluid intake. If you experience trouble breathing, this can be serious. If it is severe call 911 IMMEDIATELY. If it is mild, please call our office. If you take any of these medications: Glipizide/Metformin, Avandament, Glucavance, please do not take 48 hours after completing test unless otherwise instructed.  We will call to schedule your test 2-4 weeks out understanding that some insurance companies will need an authorization prior to the service being performed.   For more information and frequently asked  questions, please visit our website : [email or web address]  For non-scheduling related questions, please contact the cardiac imaging nurse navigator should you have any questions/concerns: Cardiac Imaging Nurse Navigators Direct Office Dial: [phone]   For scheduling needs, including cancellations and rescheduling, please call [place],  [phone].

## 2023-05-29 NOTE — Progress Notes (Signed)
Cardiology Office Note:    Date:  05/29/2023   ID:  Kristy Yang, DOB 12-14-1950, MRN [id number]  PCP:  Clemencia Course, PA-C   [facility] Providers Cardiologist:  None     Referring MD: Mellody Drown*   No chief complaint on file. CP  History of Present Illness:    Kristy Yang is a 72 y.o. female with a hx of anxiety, GERD, former smoker, HTN referral from the ED for chest pain.  Per ED documentation: " She states this symptom began 6 days ago and has progressively gotten worse. Describes it as a pressure. Localizes it to the substernal area with occasional radiation to the right arm. She states it is mostly random but occasionally occurs with exertion. She also reports associated exertional shortness of breath. She denies cardiac history outside of hypertension and hyperlipidemia. " ACS was ruled out. [facility]. She had a 3 day cardiac monitor which was unremarkable. Her chest discomfort is more significant after eating. Years ago she had a stress test, that was normal.    Past Medical History:  Diagnosis Date   Anxiety    Cataracts, bilateral    Depression    GERD (gastroesophageal reflux disease)    Grave's disease    on no medications at this time   Heart murmur    no problems   Hyperlipidemia    Hypertension    Pneumonia    as a baby    Past Surgical History:  Procedure Laterality Date   CATARACT EXTRACTION  2010   bilateral   COLONOSCOPY  2006   ORIF PATELLA Left 10/14/2019   Procedure: OPEN REDUCTION INTERNAL (ORIF) FIXATION LEFT PATELLA;  Surgeon: Cammy Copa, MD;  Location: [facility] OR;  Service: Orthopedics;  Laterality: Left;   PARTIAL HYSTERECTOMY     POLYPECTOMY      Current Medications: Current Outpatient Medications on File Prior to Visit  Medication Sig Dispense Refill   amLODipine (NORVASC) 10 MG tablet Take 1 tablet (10 mg total) by mouth daily. 90 tablet 1   atorvastatin  (LIPITOR) 20 MG tablet Take 20 mg by mouth daily.     chlorthalidone (HYGROTON) 50 MG tablet Take 50 mg by mouth daily.     ciprofloxacin (CIPRO) 500 MG tablet Take 1 tablet (500 mg total) by mouth every 12 (twelve) hours. 10 tablet 0   ibuprofen (ADVIL) 200 MG tablet Take 400 mg by mouth every 6 (six) hours as needed for headache or moderate pain.     omeprazole (PRILOSEC) 20 MG capsule Take 20 mg by mouth daily as needed (heart burn).      DULoxetine (CYMBALTA) 30 MG capsule Take 30 mg by mouth 2 (two) times daily. (Patient not taking: Reported on 05/29/2023)     HYDROcodone-acetaminophen (NORCO/VICODIN) 5-325 MG tablet Take 1 tablet by mouth every 4 (four) hours as needed. (Patient not taking: Reported on 05/29/2023) 10 tablet 0   methocarbamol (ROBAXIN) 500 MG tablet Take 1 tablet (500 mg total) by mouth every 8 (eight) hours as needed. (Patient not taking: Reported on 05/29/2023) 30 tablet 0   metroNIDAZOLE (FLAGYL) 500 MG tablet Take 1 tablet (500 mg total) by mouth 2 (two) times daily. (Patient not taking: Reported on 05/29/2023) 14 tablet 0   ondansetron (ZOFRAN-ODT) 4 MG disintegrating tablet Take 1 tablet (4 mg total) by mouth every 8 (eight) hours as needed for nausea or vomiting. (Patient not taking: Reported on 05/29/2023)  20 tablet 0   oxyCODONE (ROXICODONE) 5 MG immediate release tablet Take 1 tablet (5 mg total) by mouth every 12 (twelve) hours as needed for severe pain. 1 po q 8 hrs prn pain (Patient not taking: Reported on 05/29/2023) 30 tablet 0   potassium chloride SA (KLOR-CON M) 20 MEQ tablet Take 2 tablets (40 mEq total) by mouth daily for 5 days. (Patient not taking: Reported on 05/29/2023) 5 tablet 0   No current facility-administered medications on file prior to visit.    Allergies:   Patient has no known allergies.   Social History   Socioeconomic History   Marital status: Married    Spouse name: Not on file   Number of children: Not on file   Years of education: Not on  file   Highest education level: Not on file  Occupational History   Not on file  Tobacco Use   Smoking status: Former    Current packs/day: 0.00    Types: Cigarettes    Quit date: 07/26/1992    Years since quitting: 30.8   Smokeless tobacco: Never  Substance and Sexual Activity   Alcohol use: No   Drug use: No   Sexual activity: Not on file  Other Topics Concern   Not on file  Social History Narrative   Not on file   Social Determinants of Health   Financial Resource Strain: Low Risk  (09/06/2021)   [facility] visits prior to 01/04/2023., [facility] visits prior to 01/04/2023.   Overall Financial Resource Strain (CARDIA)    Difficulty of Paying Living Expenses: Not very hard  Food Insecurity: No Food Insecurity (09/06/2021)   Received from [facility] visits prior to 01/04/2023., [facility] visits prior to 01/04/2023.   Hunger Vital Sign    Worried About Running Out of Food in the Last Year: Never true    Ran Out of Food in the Last Year: Never true  Transportation Needs: No Transportation Needs (09/06/2021)   Received from [facility] visits prior to 01/04/2023., [facility] visits prior to 01/04/2023.   PRAPARE - Administrator, Civil Service (Medical): No    Lack of Transportation (Non-Medical): No  Physical Activity: Sufficiently Active (09/06/2021)   Received from [facility] visits prior to 01/04/2023., [facility] visits prior to 01/04/2023.   Exercise Vital Sign    Days of Exercise per Week: 5 days    Minutes of Exercise per Session: 40 min  Stress: Stress Concern Present (09/06/2021)   [facility] prior to 01/04/2023., [facility] visits prior to 01/04/2023.   [employer] of Occupational Health - Occupational Stress  Questionnaire    Feeling of Stress : To some extent  Social Connections: Moderately Integrated (09/06/2021)   Received from [facility] visits prior to 01/04/2023., [facility] visits prior to 01/04/2023.   Social Advertising account executive [NHANES]    Frequency of Communication with Friends and Family: Once a week    Frequency of Social Gatherings with Friends and Family: Once a week    Attends Religious Services: More than 4 times per year    Active Member of [employer] or Organizations: Yes    Attends Banker Meetings: 1 to 4 times per year  Marital Status: Married     Family History: The patient's family history includes Colon cancer in her maternal aunt and sister; Diabetes in her father and mother; Stroke in her maternal grandmother. There is no history of Rectal cancer, Stomach cancer, or Esophageal cancer.  ROS:   Please see the history of present illness.     All other systems reviewed and are negative.  EKGs/Labs/Other Studies Reviewed:    The following studies were reviewed today:   EKG Interpretation Date/Time:  Thursday May 29 2023 08:56:14 EDT Ventricular Rate:  78 PR Interval:  204 QRS Duration:  100 QT Interval:  402 QTC Calculation: 458 R Axis:   65  Text Interpretation: Normal sinus rhythm Non specific T wave changes When compared with ECG of 15-Apr-2023 14:06, PREVIOUS ECG IS PRESENT Confirmed by Carolan Clines ([id number]) on 05/29/2023 8:58:52 AM       Recent Labs: 04/15/2023: BUN 15; Creatinine, Ser 0.80; Hemoglobin 12.6; Platelets 232; Potassium 2.8; Sodium 137  Recent Lipid Panel    Component Value Date/Time   CHOL 239 (H) 06/15/2012 1437   TRIG 93 06/15/2012 1437   HDL 49 06/15/2012 1437   CHOLHDL 4.9 06/15/2012 1437   VLDL 19 06/15/2012 1437   LDLCALC 171 (H) 06/15/2012 1437     Risk Assessment/Calculations:         Physical Exam:    VS:  Vitals:   05/29/23 0846  BP: (!) 144/82  Pulse:  84  SpO2: 95%     Wt Readings from Last 3 Encounters:  04/15/23 169 lb (76.7 kg)  11/18/21 171 lb 15.3 oz (78 kg)  10/22/19 171 lb (77.6 kg)     GEN:  Well nourished, well developed in no acute distress HEENT: Normal NECK: No JVD; No carotid bruits LYMPHATICS: No lymphadenopathy CARDIAC: RRR, no murmurs, rubs, gallops RESPIRATORY:  Clear to auscultation without rales, wheezing or rhonchi  ABDOMEN: Soft, non-tender, non-distended MUSCULOSKELETAL:  No edema; No deformity  SKIN: Warm and dry NEUROLOGIC:  Alert and oriented x 3 PSYCHIATRIC:  Normal affect   ASSESSMENT:   CP - possible cardiac CP; describes most prominent after eating. She has risk factors including age and she is a former smoker HTN -not at goal PLAN:    In order of problems listed above:  Coronary CTA with morphology with metop tartrate 100 mg x1; BMET, Lipid profile, A1c Start coreg 3.125 mg BID; continue norvasc 10 mg daily and chlorthalidone 50 mg daily Follow up 6 months   Medication Adjustments/Labs and Tests Ordered: Current medicines are reviewed at length with the patient today.  Concerns regarding medicines are outlined above.  No orders of the defined types were placed in this encounter.  No orders of the defined types were placed in this encounter.   There are no Patient Instructions on file for this visit.   Signed, Maisie Fus, MD  05/29/2023 8:15 AM    [facility]

## 2023-05-30 LAB — BASIC METABOLIC PANEL: Calcium: 9.7 mg/dL (ref 8.7–10.3)

## 2023-06-02 ENCOUNTER — Encounter (HOSPITAL_COMMUNITY): Payer: Self-pay

## 2023-06-03 ENCOUNTER — Telehealth (HOSPITAL_COMMUNITY): Payer: Self-pay | Admitting: *Deleted

## 2023-06-03 NOTE — Telephone Encounter (Signed)
Reaching out to patient to offer assistance regarding upcoming cardiac imaging study; pt verbalizes understanding of appt date/time, parking situation and where to check in, pre-test NPO status and medications ordered, and verified current allergies; name and call back number provided for further questions should they arise  Larey Brick RN Navigator Cardiac Imaging [facility] [phone] office [phone] cell  Patient to hold her carvedilol and take 100mg  metoprolol tartrate two hours prior to her cardiac CT scan.  She is aware to arrive at 8 am.

## 2023-06-04 ENCOUNTER — Ambulatory Visit (HOSPITAL_COMMUNITY)
Admission: RE | Admit: 2023-06-04 | Discharge: 2023-06-04 | Disposition: A | Discharge status: Home / Self Care | Payer: Medicare Other | Source: Ambulatory Visit | Attending: Internal Medicine | Admitting: Internal Medicine

## 2023-06-04 DIAGNOSIS — R072 Precordial pain: Secondary | ICD-10-CM | POA: Insufficient documentation

## 2023-06-04 MED ORDER — NITROGLYCERIN 0.4 MG SL SUBL
0.8000 mg | SUBLINGUAL_TABLET | Freq: Once | SUBLINGUAL | Status: AC
  Administered 2023-06-04: 0.8 mg via SUBLINGUAL

## 2023-06-04 MED ORDER — NITROGLYCERIN 0.4 MG SL SUBL
SUBLINGUAL_TABLET | SUBLINGUAL | Status: AC
  Filled 2023-06-04: qty 2

## 2023-06-04 MED ORDER — IOHEXOL 350 MG/ML SOLN
100.0000 mL | Freq: Once | INTRAVENOUS | Status: AC | PRN
  Administered 2023-06-04: 100 mL via INTRAVENOUS

## 2023-06-10 ENCOUNTER — Other Ambulatory Visit: Payer: Self-pay | Admitting: Physician Assistant

## 2023-06-10 DIAGNOSIS — Z1231 Encounter for screening mammogram for malignant neoplasm of breast: Secondary | ICD-10-CM

## 2023-08-02 ENCOUNTER — Ambulatory Visit
Admission: RE | Admit: 2023-08-02 | Discharge: 2023-08-02 | Disposition: A | Discharge status: Home / Self Care | Payer: Medicare Other | Source: Ambulatory Visit | Attending: Physician Assistant | Admitting: Physician Assistant

## 2023-08-02 DIAGNOSIS — Z1231 Encounter for screening mammogram for malignant neoplasm of breast: Secondary | ICD-10-CM

## 2023-11-25 ENCOUNTER — Encounter: Payer: Self-pay | Admitting: Internal Medicine

## 2023-11-25 ENCOUNTER — Ambulatory Visit: Payer: PRIVATE HEALTH INSURANCE | Attending: Internal Medicine | Admitting: Internal Medicine

## 2023-11-25 VITALS — BP 128/78 | HR 70 | Ht 61.5 in | Wt 168.0 lb

## 2023-11-25 DIAGNOSIS — R072 Precordial pain: Secondary | ICD-10-CM

## 2023-11-25 DIAGNOSIS — I10 Essential (primary) hypertension: Secondary | ICD-10-CM | POA: Diagnosis not present

## 2023-11-25 NOTE — Progress Notes (Signed)
Cardiology Office Note:    Date:  11/25/2023   ID:  Kristy Yang, DOB 1950-11-26, MRN [id number]  PCP:  Clemencia Course, PA-C   [facility] Providers Cardiologist:  None     Referring MD: Mellody Drown*   Chief Complaint  Patient presents with   Follow-up    6 months.  CP  History of Present Illness:    Kristy Yang is a 73 y.o. female with a hx of anxiety, GERD, former smoker, HTN referral from the ED for chest pain.  Per ED documentation: " She states this symptom began 6 days ago and has progressively gotten worse. Describes it as a pressure. Localizes it to the substernal area with occasional radiation to the right arm. She states it is mostly random but occasionally occurs with exertion. She also reports associated exertional shortness of breath. She denies cardiac history outside of hypertension and hyperlipidemia. " ACS was ruled out. [facility]. She had a 3 day cardiac monitor which was unremarkable. Her chest discomfort is more significant after eating. Years ago she had a stress test, that was normal.   Interval 11/25/2023 She is doing well.  Her BP is good today.   She denies any cardiac symptoms.  Her goal is to increase her physical activity with the [employer] across the street.  We discussed considering just walking most days of the week.  Otherwise she has done well with diet and cutting carbohydrates    Current Medications: Current Outpatient Medications on File Prior to Visit  Medication Sig Dispense Refill   amLODipine (NORVASC) 10 MG tablet Take 1 tablet (10 mg total) by mouth daily. 90 tablet 1   atorvastatin (LIPITOR) 20 MG tablet Take 20 mg by mouth daily.     carvedilol (COREG) 3.125 MG tablet Take 1 tablet (3.125 mg total) by mouth 2 (two) times daily with a meal. 180 tablet 3   chlorthalidone (HYGROTON) 50 MG tablet Take 50 mg by mouth daily.     ciprofloxacin (CIPRO) 500 MG tablet Take 1 tablet  (500 mg total) by mouth every 12 (twelve) hours. 10 tablet 0   DULoxetine (CYMBALTA) 30 MG capsule Take 30 mg by mouth 2 (two) times daily. (Patient not taking: Reported on 05/29/2023)     HYDROcodone-acetaminophen (NORCO/VICODIN) 5-325 MG tablet Take 1 tablet by mouth every 4 (four) hours as needed. (Patient not taking: Reported on 05/29/2023) 10 tablet 0   ibuprofen (ADVIL) 200 MG tablet Take 400 mg by mouth every 6 (six) hours as needed for headache or moderate pain.     methocarbamol (ROBAXIN) 500 MG tablet Take 1 tablet (500 mg total) by mouth every 8 (eight) hours as needed. (Patient not taking: Reported on 05/29/2023) 30 tablet 0   metoprolol tartrate (LOPRESSOR) 100 MG tablet Take 1 tablet (100 mg total) by mouth once for 1 dose. Take 1 tablet (100 mg total) two hours prior to CT scan. 1 tablet 0   metroNIDAZOLE (FLAGYL) 500 MG tablet Take 1 tablet (500 mg total) by mouth 2 (two) times daily. (Patient not taking: Reported on 05/29/2023) 14 tablet 0   omeprazole (PRILOSEC) 20 MG capsule Take 20 mg by mouth daily as needed (heart burn).      ondansetron (ZOFRAN-ODT) 4 MG disintegrating tablet Take 1 tablet (4 mg total) by mouth every 8 (eight) hours as needed for nausea or vomiting. (Patient not taking: Reported on 05/29/2023) 20 tablet 0   oxyCODONE (ROXICODONE)  5 MG immediate release tablet Take 1 tablet (5 mg total) by mouth every 12 (twelve) hours as needed for severe pain. 1 po q 8 hrs prn pain (Patient not taking: Reported on 05/29/2023) 30 tablet 0   potassium chloride SA (KLOR-CON M) 20 MEQ tablet Take 2 tablets (40 mEq total) by mouth daily for 5 days. (Patient not taking: Reported on 05/29/2023) 5 tablet 0   No current facility-administered medications on file prior to visit.    Allergies:   Patient has no known allergies.  Family History: The patient's family history includes Colon cancer in her maternal aunt and sister; Diabetes in her father and mother; Stroke in her maternal  grandmother. There is no history of Rectal cancer, Stomach cancer, Esophageal cancer, or Breast cancer.  ROS:   Please see the history of present illness.     All other systems reviewed and are negative.  EKGs/Labs/Other Studies Reviewed:    The following studies were reviewed today:   EKG Interpretation Date/Time:  Tuesday November 25 2023 09:15:11 EST Ventricular Rate:  70 PR Interval:  200 QRS Duration:  100 QT Interval:  394 QTC Calculation: 425 R Axis:   -5  Text Interpretation: Normal sinus rhythm Cannot rule out Anterior infarct , age undetermined When compared with ECG of 29-May-2023 08:56, Borderline criteria for Inferior infarct are now Present T wave inversion no longer evident in Inferior leads Confirmed by Carolan Clines [phone]) on 11/25/2023 9:19:17 AM        Recent Labs: 04/15/2023: Hemoglobin 12.6; Platelets 232 05/29/2023: BUN 14; Creatinine, Ser 0.76; Potassium 3.6; Sodium 144  Recent Lipid Panel    Component Value Date/Time   CHOL 195 05/29/2023 0936   TRIG 91 05/29/2023 0936   HDL 54 05/29/2023 0936   CHOLHDL 3.6 05/29/2023 0936   CHOLHDL 4.9 06/15/2012 1437   VLDL 19 06/15/2012 1437   LDLCALC 125 (H) 05/29/2023 0936     Risk Assessment/Calculations:         Physical Exam:    VS:  Vitals:   11/25/23 0908  BP: 128/78  Pulse: 70     Wt Readings from Last 3 Encounters:  11/25/23 168 lb (76.2 kg)  05/29/23 173 lb 12.8 oz (78.8 kg)  04/15/23 169 lb (76.7 kg)     GEN:  Well nourished, well developed in no acute distress HEENT: Normal NECK: No JVD; No carotid bruits LYMPHATICS: No lymphadenopathy CARDIAC: RRR, no murmurs, rubs, gallops RESPIRATORY:  Clear to auscultation without rales, wheezing or rhonchi  ABDOMEN: Soft, non-tender, non-distended MUSCULOSKELETAL:  No edema; No deformity  SKIN: Warm and dry NEUROLOGIC:  Alert and oriented x 3 PSYCHIATRIC:  Normal affect   ASSESSMENT:   Noncardiac CP -describes most prominent after eating.  She has risk factors including age and she is a former smoker.  I ordered a coronary CTA which showed a low calcium score of 38 which is 46 percentile for age.  HTN Well-controlled -Continue carvedilol 3.125 mg twice daily -Continue current plan Eletone 50 mg daily -Continue Norvasc 10 mg daily  CAC/HLD Very minimal per above -Continue Lipitor 40 mg  PLAN:    In order of problems listed above:  No changes Follow up in 6 months with an APP   Medication Adjustments/Labs and Tests Ordered: Current medicines are reviewed at length with the patient today.  Concerns regarding medicines are outlined above.  Orders Placed This Encounter  Procedures   EKG 12-Lead   No orders of the defined types were  placed in this encounter.   There are no Patient Instructions on file for this visit.   Signed, Maisie Fus, MD  11/25/2023 9:09 AM    [facility]

## 2023-11-25 NOTE — Patient Instructions (Signed)
Medication Instructions:  No changes at this time  *If you need a refill on your cardiac medications before your next appointment, please call your pharmacy*   Lab Work: None  If you have labs (blood work) drawn today and your tests are completely normal, you will receive your results only by: MyChart Message (if you have MyChart) OR A paper copy in the mail If you have any lab test that is abnormal or we need to change your treatment, we will call you to review the results.   Testing/Procedures: None    Follow-Up: At [facility], you and your health needs are our priority.  As part of our continuing mission to provide you with exceptional heart care, we have created designated Provider Care Teams.  These Care Teams include your primary Cardiologist (physician) and Advanced Practice Providers (APPs -  Physician Assistants and Nurse Practitioners) who all work together to provide you with the care you need, when you need it.  Your next appointment:   6 month(s)  Provider:   With an APP or NP  Other Instructions

## 2023-12-27 IMAGING — CT CT CTA ABD/PEL W/CM AND/OR W/O CM
3 of 12 series · 12 of 46 positions shown, 17 images · IV contrast (agent unspecified)
Comparison: US Renal, 02/10/2013.  KUB, 02/10/2013.

CLINICAL DATA: GI bleed, lower

EXAM:
CTA ABDOMEN AND PELVIS WITHOUT AND WITH CONTRAST
TECHNIQUE: Multidetector CT imaging of the abdomen and pelvis was performed
using the standard protocol during bolus administration of
intravenous contrast. Multiplanar reconstructed images and MIPs were
obtained and reviewed to evaluate the vascular anatomy.

[Series 7: axial arterial · axial · arterial · 0.65mm/px · z∈[-369,-57]mm · 7 of 215 slices shown]
[im 20/215  soft-tissue]
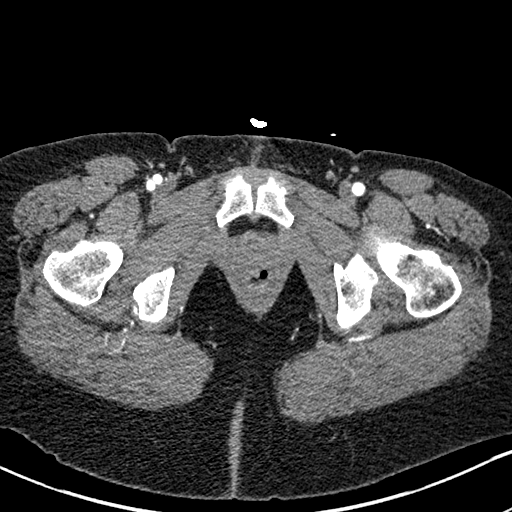
[im 39/215  soft-tissue]
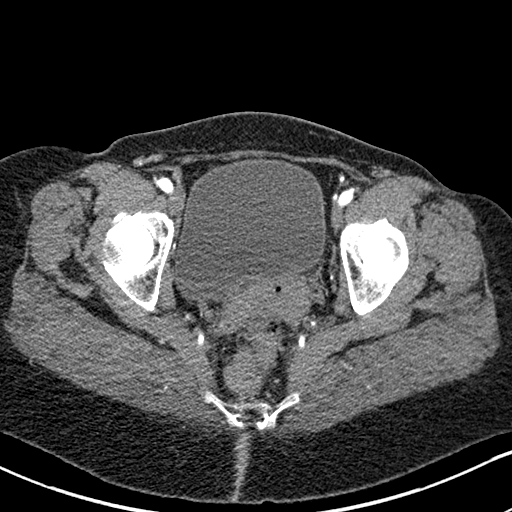
[im 78/215  soft-tissue]
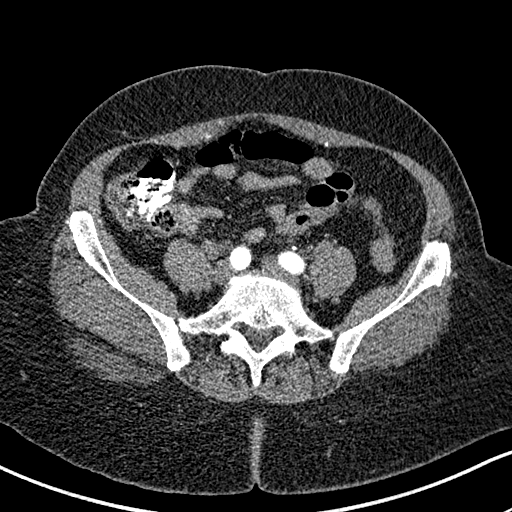
[im 98/215  soft-tissue]
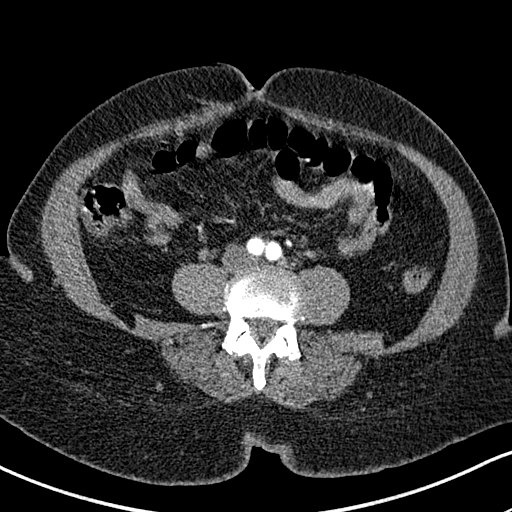
[im 117/215  soft-tissue]
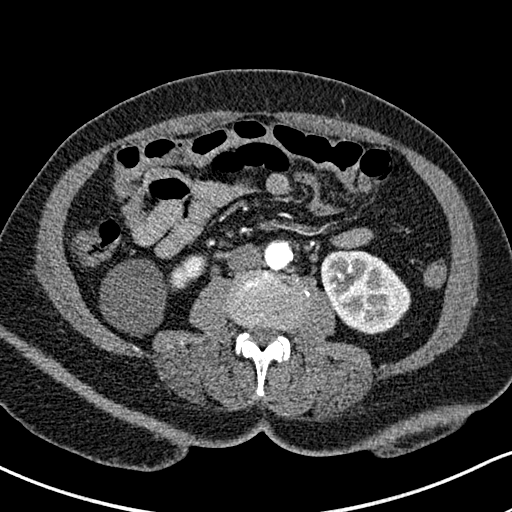
[im 137/215  soft-tissue]
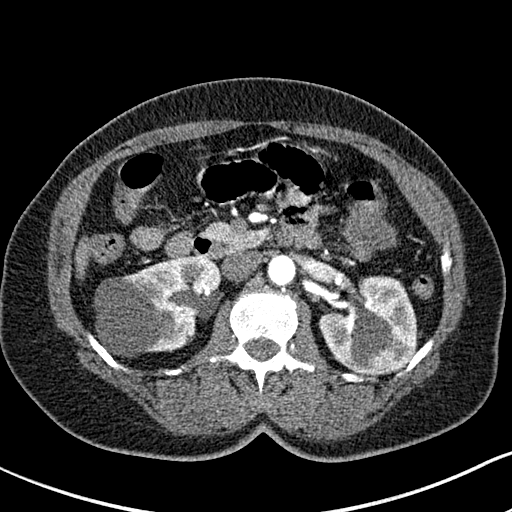
[im 176/215  soft-tissue]
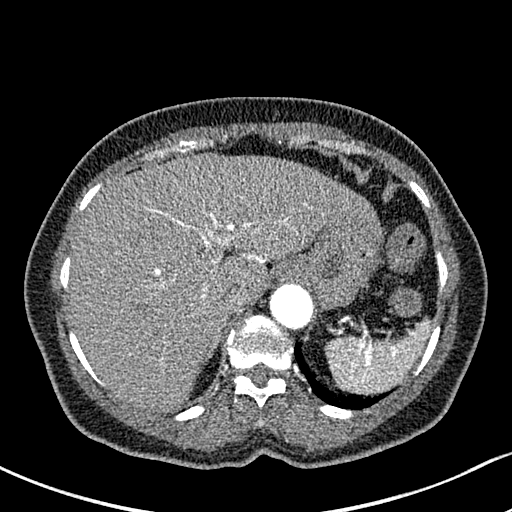

[Series 10: axial portal venous · axial · portal-venous · 0.65mm/px · z∈[-300,-90]mm · 3 of 86 slices shown, 7 images]
[im 22/86  soft-tissue]
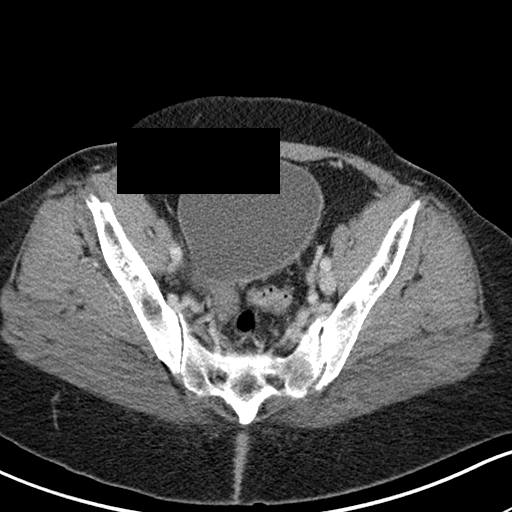
[im 22/86  lung]
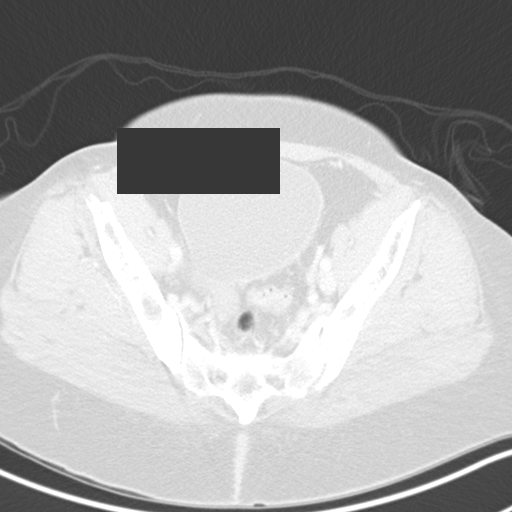
[im 22/86  bone]
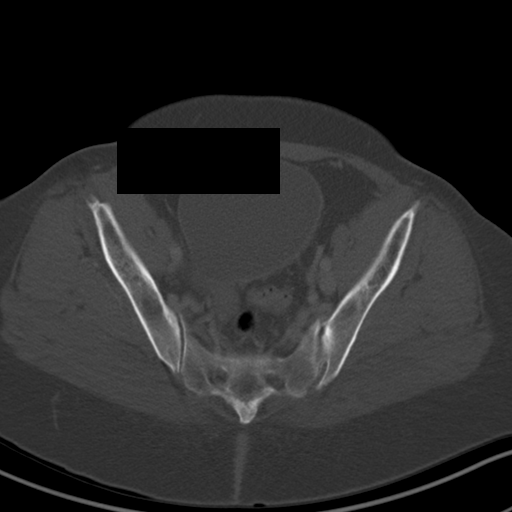
[im 43/86  soft-tissue]
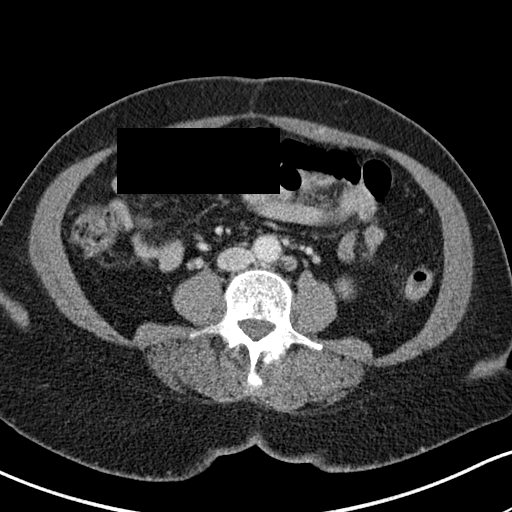
[im 43/86  lung]
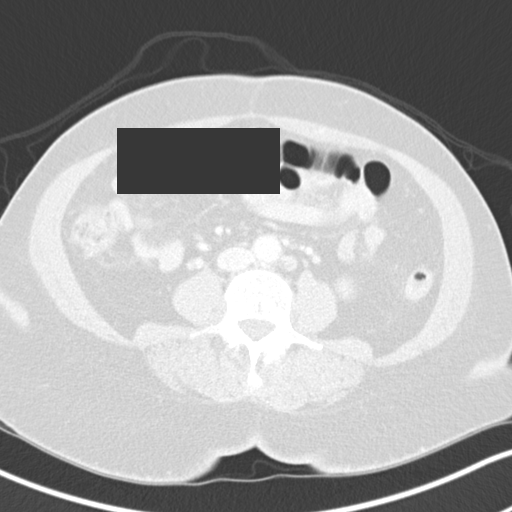
[im 64/86  soft-tissue]
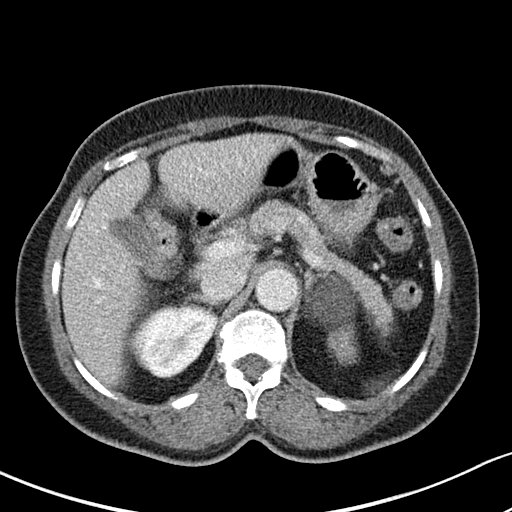
[im 64/86  lung]
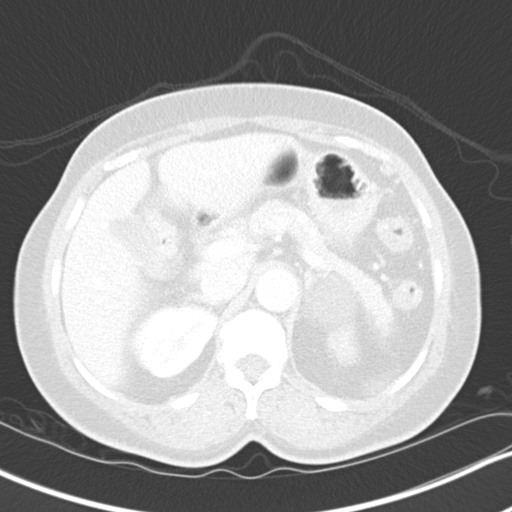

[Series 12: coronal arterial · coronal · arterial · 0.70mm/px · 2 of 148 slices shown, 3 images]
[im 50/148  soft-tissue]
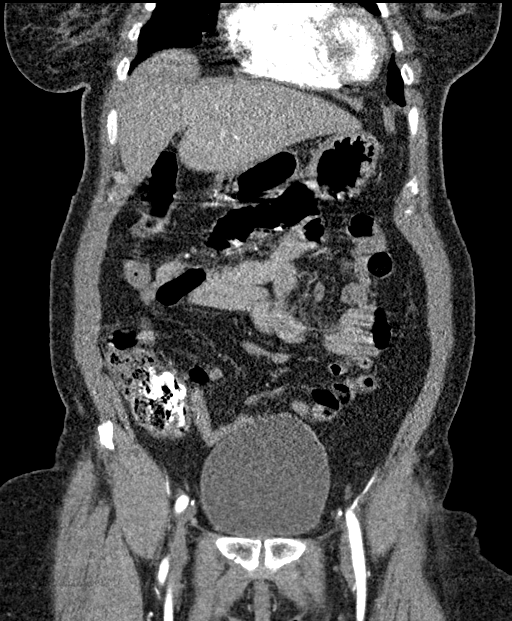
[im 50/148  bone]
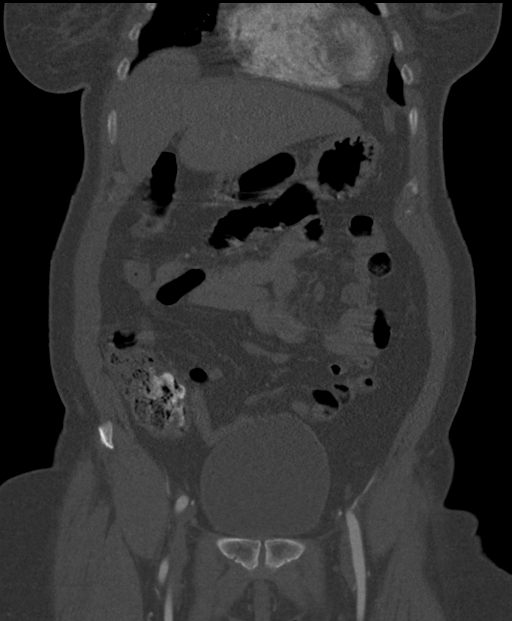
[im 99/148  soft-tissue]
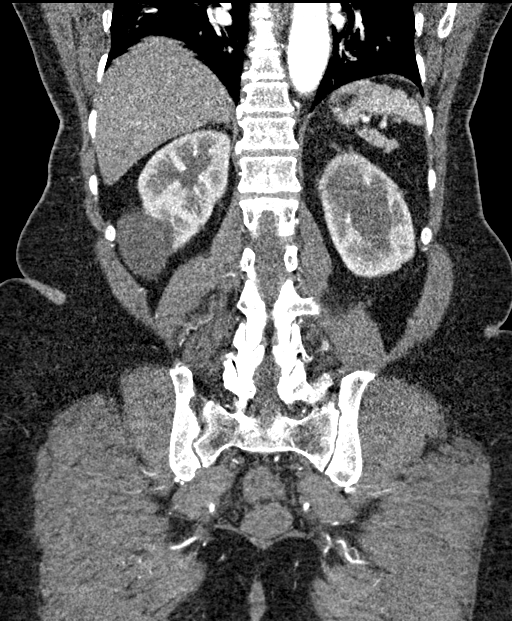

[12 of 46 positions shown; findings below may reference images not displayed]

RADIATION DOSE REDUCTION: This exam was performed according to the
departmental dose-optimization program which includes automated
exposure control, adjustment of the mA and/or kV according to
patient size and/or use of iterative reconstruction technique.

CONTRAST:  100mL OMNIPAQUE IOHEXOL 350 MG/ML SOLN
FINDINGS: Suboptimal evaluation, secondary to motion degradation.

VASCULAR

Aorta: Normal caliber aorta without aneurysm, dissection, vasculitis
or significant stenosis.

Celiac: Widely patent without evidence of aneurysm, dissection,
vasculitis or significant stenosis.

SMA: Widely patent without evidence of aneurysm, dissection,
vasculitis or significant stenosis.

Renals: Single renal arteries present bilaterally. Both renal
arteries are patent without evidence of aneurysm, dissection,
vasculitis, fibromuscular dysplasia or significant stenosis.

IMA: Widely patent without evidence of aneurysm, dissection,
vasculitis or significant stenosis.

Pelvis: Widely patent without evidence of aneurysm, dissection,
vasculitis or significant stenosis.

Proximal Outflow: Bilateral common femoral and visualized portions
of the superficial and profunda femoral arteries are patent without
evidence of aneurysm, dissection, vasculitis or significant
stenosis.

Veins: No obvious venous abnormality within the limitations of this
arterial phase study.

Review of the MIP images confirms the above findings.

NON-VASCULAR

Lower chest: No acute abnormality.

Hepatobiliary: No focal liver abnormality is seen. No gallstones,
gallbladder wall thickening, or biliary dilatation.

Pancreas: No pancreatic ductal dilatation or surrounding
inflammatory changes.

Spleen: Normal in size without focal abnormality.

Adrenals/Urinary Tract: Adrenal glands are unremarkable. Large
bilateral exophytic and LEFT parapelvic cysts. No radiodense renal
calculus or hydronephrosis. Bladder is moderately distended.

Stomach/Bowel: Stomach is within normal limits. Appendix is not
definitively visualized. Mild prominence of central small bowel loop
without discrete dilation. Intraluminal contrast opacification of
cecum, likely ingested. Nondilated colon. No evidence of bowel wall
thickening, distention, or inflammatory changes.

Lymphatic: No enlarged abdominal or pelvic lymph nodes.

Reproductive: Status post hysterectomy. No adnexal masses.

Other: No abdominal wall hernia or abnormality. No abdominopelvic
ascites.

Musculoskeletal: No acute osseous findings.
IMPRESSION: VASCULAR

1. No evidence of active extravasation to explain patient's reported
lower GI bleed.

NON-VASCULAR

1. No acute abdominopelvic process.
2.  Additional incidental, chronic and senescent findings as above.

## 2024-03-25 ENCOUNTER — Encounter: Payer: Self-pay | Admitting: Internal Medicine

## 2024-05-21 ENCOUNTER — Other Ambulatory Visit: Payer: Self-pay

## 2024-05-21 MED ORDER — CARVEDILOL 3.125 MG PO TABS: 3.1250 mg | ORAL_TABLET | Freq: Two times a day (BID) | ORAL | 1 refills | Status: DC

## 2024-09-21 ENCOUNTER — Other Ambulatory Visit: Payer: Self-pay | Admitting: Physician Assistant

## 2024-09-21 DIAGNOSIS — Z1231 Encounter for screening mammogram for malignant neoplasm of breast: Secondary | ICD-10-CM

## 2024-09-22 ENCOUNTER — Inpatient Hospital Stay
Admission: RE | Admit: 2024-09-22 | Discharge: 2024-09-22 | Payer: PRIVATE HEALTH INSURANCE | Attending: Physician Assistant | Admitting: Physician Assistant

## 2024-09-22 DIAGNOSIS — Z1231 Encounter for screening mammogram for malignant neoplasm of breast: Secondary | ICD-10-CM

## 2024-11-13 ENCOUNTER — Other Ambulatory Visit: Payer: Self-pay | Admitting: Physician Assistant
